# Patient Record
Sex: Male | Born: 1985 | Race: Black or African American | Hispanic: No | Marital: Single | Smoking: Never smoker
Health system: Southern US, Community
[De-identification: ages and names within clinical notes are randomized; demographics above are authoritative.]

## PROBLEM LIST (undated history)

## (undated) DIAGNOSIS — J45909 Unspecified asthma, uncomplicated: Secondary | ICD-10-CM

---

## 2017-07-12 ENCOUNTER — Emergency Department (HOSPITAL_COMMUNITY)
Admission: EM | Admit: 2017-07-12 | Discharge: 2017-07-13 | Disposition: A | Discharge status: Home / Self Care | Payer: Self-pay | Attending: Emergency Medicine | Admitting: Emergency Medicine

## 2017-07-12 DIAGNOSIS — F17200 Nicotine dependence, unspecified, uncomplicated: Secondary | ICD-10-CM | POA: Insufficient documentation

## 2017-07-12 DIAGNOSIS — J45909 Unspecified asthma, uncomplicated: Secondary | ICD-10-CM | POA: Insufficient documentation

## 2017-07-12 DIAGNOSIS — F1010 Alcohol abuse, uncomplicated: Secondary | ICD-10-CM | POA: Insufficient documentation

## 2017-07-12 HISTORY — DX: Unspecified asthma, uncomplicated: J45.909

## 2017-07-13 ENCOUNTER — Encounter (HOSPITAL_COMMUNITY): Payer: Self-pay | Admitting: *Deleted

## 2017-07-13 ENCOUNTER — Other Ambulatory Visit: Payer: Self-pay

## 2017-07-13 NOTE — ED Notes (Signed)
Pt wanded in triage, Pt was asked to remove his belongings. Pt had a pack of cigarettes, lighter, tissue and a loose cigarette. Verified by security that wanded pt.

## 2017-07-13 NOTE — ED Triage Notes (Addendum)
Pt stated "my mother brought me here.  She wanted me to come here.  I don't drink on the week days.  I ain't going to lie to you, I do drink and smoke marijuana.  I smoke marijuana every day.  I drink maybe 5 beers out of a 12 pack on the weekends."

## 2017-07-13 NOTE — Discharge Instructions (Signed)
To find a primary care or specialty doctor please call [phone] or [phone] to access "[facility] [facility] Find a Doctor Service." ° °You may also go on the [facility] website at [email or web address] ° °There are also multiple [facility], [facility] practices throughout the [place] that are frequently accepting new patients. You may find a clinic that is close to your home and contact them. ° °[facility] -  °[address] °[place] [address] °[phone] ° ° °[facility] -  °[address] °[place] [address] [phone] ° ° °[facility] - °[address]  °[place] [address] °[phone] ° ° °

## 2017-07-13 NOTE — ED Provider Notes (Signed)
TIME SEEN: 1:07 AM  CHIEF COMPLAINT: "My family wanted me to come here for alcohol detox"  HPI: Patient is a 31 year old male with history of alcohol abuse who presents to the emergency department stating that his family wanted him to come for evaluation for alcohol detox.  He states that he drinks several beers and liquor daily.  States he is able to go several days without drinking and never has hallucinations, tremors or seizures.  He smokes marijuana occasionally.  He does report drinking tonight.  Denies SI, HI or hallucinations.  He denies any medical complaints including pain, fevers, cough, shortness of breath, vomiting, diarrhea.  He states he is going to rehab once before.  ROS: See HPI Constitutional: no fever  Eyes: no drainage  ENT: no runny nose   Cardiovascular:  no chest pain  Resp: no SOB  GI: no vomiting GU: no dysuria Integumentary: no rash  Allergy: no hives  Musculoskeletal: no leg swelling  Neurological: no slurred speech ROS otherwise negative  PAST MEDICAL HISTORY/PAST SURGICAL HISTORY:  Past Medical History:  Diagnosis Date  . Asthma    Pt stated "as a child"    MEDICATIONS:  Prior to Admission medications   Not on File    ALLERGIES:  Allergies not on file  SOCIAL HISTORY:  Social History   Tobacco Use  . Smoking status: Current Every Day Smoker  Substance Use Topics  . Alcohol use: Yes    FAMILY HISTORY: No family history on file.  EXAM: BP 139/86 (BP Location: Right Arm)   Pulse 89   Temp 97.9 F (36.6 C) (Oral)   Resp 20   Ht 6\' 4"  (1.93 m)   Wt 87.1 kg (192 lb)   SpO2 100%   BMI 23.37 kg/m  CONSTITUTIONAL: Alert and oriented and responds appropriately to questions. Well-appearing; well-nourished, smiling, pleasant, no complaints, well-hydrated, afebrile HEAD: Normocephalic EYES: Conjunctivae clear, pupils appear equal, EOMI ENT: normal nose; moist mucous membranes NECK: Supple, no meningismus, no nuchal rigidity, no LAD   CARD: RRR; S1 and S2 appreciated; no murmurs, no clicks, no rubs, no gallops RESP: Normal chest excursion without splinting or tachypnea; breath sounds clear and equal bilaterally; no wheezes, no rhonchi, no rales, no hypoxia or respiratory distress, speaking full sentences ABD/GI: Normal bowel sounds; non-distended; soft, non-tender, no rebound, no guarding, no peritoneal signs, no hepatosplenomegaly BACK:  The back appears normal and is non-tender to palpation, there is no CVA tenderness EXT: Normal ROM in all joints; non-tender to palpation; no edema; normal capillary refill; no cyanosis, no calf tenderness or swelling    SKIN: Normal color for age and race; warm; no rash NEURO: Moves all extremities equally PSYCH: The patient's mood and manner are appropriate. Grooming and personal hygiene are appropriate.  No SI, HI or hallucinations.  MEDICAL DECISION MAKING: Patient here with request for alcohol detox.  He has never had life-threatening withdrawal before.  No history of DTs, seizures or even tremors.  He is able to go several days without drinking without any issue.  I do not feel he needs admission to the hospital for detox.  He looks very comfortable currently and not significantly intoxicated.  He has no psychiatric safety concern.  No medical complaints.  I feel he can follow-up with a rehab facility as an outpatient.  I feel he needs to set this up on his own when he is ready to quit drinking.   At this time, I do not feel there is any  life-threatening condition present. I have reviewed and discussed all results (EKG, imaging, lab, urine as appropriate) and exam findings with patient/family. I have reviewed nursing notes and appropriate previous records.  I feel the patient is safe to be discharged home without further emergent workup and can continue workup as an outpatient as needed. Discussed usual and customary return precautions. Patient/family verbalize understanding and are  comfortable with this plan.  Outpatient follow-up has been provided if needed. All questions have been answered.      Champion Corales, Layla MawKristen N, DO 07/13/17 0111

## 2017-07-13 NOTE — ED Notes (Signed)
Pt now stating "I talked to my mother and she has my wallet."  Security @ BS when pt stated this.

## 2017-07-13 NOTE — ED Notes (Signed)
Pt now stating "I had a pack of cigarettes and my wallet in the bag."  Informed pt he had bagged his own possessions and bag was placed in locker.  Pt stated "well, don't worry about it."

## 2017-10-20 ENCOUNTER — Encounter (HOSPITAL_COMMUNITY): Payer: Self-pay | Admitting: Emergency Medicine

## 2017-10-20 ENCOUNTER — Emergency Department (HOSPITAL_COMMUNITY)
Admission: EM | Admit: 2017-10-20 | Discharge: 2017-10-20 | Disposition: A | Discharge status: Home / Self Care | Payer: Self-pay | Attending: Emergency Medicine | Admitting: Emergency Medicine

## 2017-10-20 ENCOUNTER — Other Ambulatory Visit: Payer: Self-pay

## 2017-10-20 DIAGNOSIS — R21 Rash and other nonspecific skin eruption: Secondary | ICD-10-CM | POA: Insufficient documentation

## 2017-10-20 DIAGNOSIS — F17210 Nicotine dependence, cigarettes, uncomplicated: Secondary | ICD-10-CM | POA: Insufficient documentation

## 2017-10-20 DIAGNOSIS — L298 Other pruritus: Secondary | ICD-10-CM | POA: Insufficient documentation

## 2017-10-20 LAB — URINALYSIS, ROUTINE W REFLEX MICROSCOPIC
Bilirubin Urine: NEGATIVE
Glucose, UA: NEGATIVE mg/dL
Hgb urine dipstick: NEGATIVE
Ketones, ur: NEGATIVE mg/dL
LEUKOCYTES UA: NEGATIVE
NITRITE: NEGATIVE
PH: 6 (ref 5.0–8.0)
Protein, ur: NEGATIVE mg/dL
SPECIFIC GRAVITY, URINE: 1.014 (ref 1.005–1.030)

## 2017-10-20 MED ORDER — PERMETHRIN 5 % EX CREA: TOPICAL_CREAM | CUTANEOUS | 0 refills | Status: DC

## 2017-10-20 MED ORDER — HYDROXYZINE HCL 25 MG PO TABS: 25.0000 mg | ORAL_TABLET | Freq: Four times a day (QID) | ORAL | 0 refills | Status: DC

## 2017-10-20 NOTE — ED Notes (Signed)
Pt noticed a pruritic rash that started on his genitalia and spread to the rest of his body onset two weeks ago.

## 2017-10-20 NOTE — ED Provider Notes (Signed)
[facility]-EMERGENCY DEPT Provider Note   CSN: [id number] Arrival date & time: 10/20/17  0507    History   Chief Complaint Chief Complaint  Patient presents with  . SEXUALLY TRANSMITTED DISEASE    HPI Jeremy Cline is a 32 y.o. male who presents with a rash and concern for STD. He states that he's had a rash on his penis for the past 2 weeks. It is very itchy. He called the person he had sexual contact with and they denied they had any STD symptoms. He also reports a rash on his entire body. He has never had an STD or symptoms like this before. He denies fever, chills, abdominal pain, N/V, penis pain, testicular pain, genital sores, penile discharge or dysuria.  HPI  Past Medical History:  Diagnosis Date  . Asthma    Pt stated "as a child"    There are no active problems to display for this patient.   History reviewed. No pertinent surgical history.     Home Medications    Prior to Admission medications   Not on File    Family History History reviewed. No pertinent family history.  Social History Social History   Tobacco Use  . Smoking status: Current Every Day Smoker  . Smokeless tobacco: Never Used  Substance Use Topics  . Alcohol use: Yes  . Drug use: Yes    Types: Marijuana     Allergies   Patient has no known allergies.   Review of Systems Review of Systems  Constitutional: Negative for fever.  Genitourinary: Negative for dysuria and genital sores.  Skin: Positive for rash.     Physical Exam Updated Vital Signs BP (!) 145/73 (BP Location: Right Arm)   Pulse (!) 105   Temp (!) 97.4 F (36.3 C) (Oral)   Resp 16   Ht 6\' 4"  (1.93 m)   Wt 88.5 kg (195 lb 3.2 oz)   SpO2 100%   BMI 23.76 kg/m   Physical Exam  Constitutional: He is oriented to person, place, and time. He appears well-developed and well-nourished. No distress.  HENT:  Head: Normocephalic and atraumatic.  Eyes: Conjunctivae are normal. Pupils are  equal, round, and reactive to light. Right eye exhibits no discharge. Left eye exhibits no discharge. No scleral icterus.  Neck: Normal range of motion.  Cardiovascular: Normal rate.  Pulmonary/Chest: Effort normal. No respiratory distress.  Abdominal: He exhibits no distension.  Genitourinary:  Genitourinary Comments: No inguinal lymphadenopathy or inguinal hernia noted. Circumcised penis which has a papular rash consistent with rash on the rest of the body. No ulcers. Testicles are nontender with normal lie. Normal scrotal appearance. No obvious discharge noted. Chaperone Olena Leatherwood(Janeen, RN) present during exam.    Neurological: He is alert and oriented to person, place, and time.  Skin: Skin is warm and dry. Rash (generalized, erythematous, papular rash with evidence of burrowing in between web spaces) noted.  Psychiatric: He has a normal mood and affect. His behavior is normal.  Nursing note and vitals reviewed.    ED Treatments / Results  Labs (all labs ordered are listed, but only abnormal results are displayed) Labs Reviewed  URINALYSIS, ROUTINE W REFLEX MICROSCOPIC  RPR  HIV ANTIBODY (ROUTINE TESTING)  GC/CHLAMYDIA PROBE AMP ([place]) NOT AT [facility]    EKG  EKG Interpretation None       Radiology No results found.  Procedures Procedures (including critical care time)  Medications Ordered in ED Medications - No data to display  Initial Impression / Assessment and Plan / ED Course  I have reviewed the triage vital signs and the nursing notes.  Pertinent labs & imaging results that were available during my care of the patient were reviewed by me and considered in my medical decision making (see chart for details).  32 year old male presents with generalized rash. He is concerned he has an STD however exam is not consistent with this. Exam is most consistent with scabies. STD screening tests were sent.  UA is negative. He will be treated with Permethrin and given rx for  Atarax. He was advised to wash clothes and bedding. He was given return precautions.  Final Clinical Impressions(s) / ED Diagnoses   Final diagnoses:  Rash and nonspecific skin eruption    ED Discharge Orders    None       Bethel Born, PA-C 10/20/17 0756    Phillis Haggis, MD 10/20/17 0800

## 2017-10-20 NOTE — ED Triage Notes (Signed)
Patient is complaining of a STD. Patient has a rash on his genitalia area. Patient state that is reddened. Patient states that he is itching. No discharge from penis. Patient is not having painful urination.

## 2017-10-20 NOTE — Discharge Instructions (Signed)
Apply permethrin cream from the neck down. Keep on for 8 hours and wash off. You can repeat in one week if your rash is not improving You STD screen was sent off today. You will be notified if anything is abnormal. Take Hydroxyzine for itching as needed. This medicine can make you sleepy. Wash all clothes and bedding in hot water Return if worsening

## 2017-10-21 LAB — RPR: RPR Ser Ql: NONREACTIVE

## 2017-10-21 LAB — HIV ANTIBODY (ROUTINE TESTING W REFLEX): HIV Screen 4th Generation wRfx: NONREACTIVE

## 2017-10-22 LAB — GC/CHLAMYDIA PROBE AMP (~~LOC~~) NOT AT ARMC
CHLAMYDIA, DNA PROBE: NEGATIVE
Neisseria Gonorrhea: NEGATIVE

## 2018-10-29 ENCOUNTER — Encounter (HOSPITAL_COMMUNITY): Payer: Self-pay

## 2018-10-29 ENCOUNTER — Ambulatory Visit (HOSPITAL_COMMUNITY)
Admission: EM | Admit: 2018-10-29 | Discharge: 2018-10-29 | Disposition: A | Discharge status: Home / Self Care | Payer: Self-pay | Attending: Family Medicine | Admitting: Family Medicine

## 2018-10-29 ENCOUNTER — Other Ambulatory Visit: Payer: Self-pay

## 2018-10-29 DIAGNOSIS — J111 Influenza due to unidentified influenza virus with other respiratory manifestations: Secondary | ICD-10-CM

## 2018-10-29 DIAGNOSIS — R69 Illness, unspecified: Secondary | ICD-10-CM

## 2018-10-29 NOTE — Discharge Instructions (Addendum)

## 2018-10-29 NOTE — ED Provider Notes (Signed)
[facility]   [id number] 10/29/18 Arrival Time: 3785  ASSESSMENT & PLAN:  1. Influenza-like illness    See AVS for discharge instructions.  Discussed typical duration of symptoms. OTC symptom care as needed. Ensure adequate fluid intake and rest. May f/u with PCP or here as needed.  Reviewed expectations re: course of current medical issues. Questions answered. Outlined signs and symptoms indicating need for more acute intervention. Patient verbalized understanding. After Visit Summary given.   SUBJECTIVE: History from: patient.  Jeremy Cline is a 33 y.o. male who presents with complaint of nasal congestion, post-nasal drainage, and a persistent dry cough; without sore throat. Onset abrupt, 2 d ago; with fatigue and with body aches. SOB: none. Wheezing: none. Occasional loose stool without frank diarrhea. Fever: questions subjective with chills. Overall normal PO intake without n/v. Known sick contacts: none. No specific or significant aggravating or alleviating factors reported. OTC treatment: TheraFlu without much relief.  Social History   Tobacco Use  Smoking Status Current Every Day Smoker  Smokeless Tobacco Never Used    ROS: As per HPI.   OBJECTIVE:  Vitals:   10/29/18 0955 10/29/18 0956  BP: (!) 149/89   Pulse: 82   Resp: 18   Temp: 98.2 F (36.8 C)   TempSrc: Oral   SpO2: 100%   Weight:  83.9 kg    General appearance: alert; appears fatigued HEENT: nasal congestion; clear runny nose; throat irritation secondary to post-nasal drainage Neck: supple without LAD CV: RRR Lungs: unlabored respirations, symmetrical air entry without wheezing; cough: mild Abd: soft Ext: no LE edema Skin: warm and dry Psychological: alert and cooperative; normal mood and affect   No Known Allergies  Past Medical History:  Diagnosis Date  . Asthma    Pt stated "as a child"   Family History  Problem Relation Age of Onset  . Diabetes Mother    Social  History   Socioeconomic History  . Marital status: Single    Spouse name: Not on file  . Number of children: Not on file  . Years of education: Not on file  . Highest education level: Not on file  Occupational History  . Not on file  Social Needs  . Financial resource strain: Not on file  . Food insecurity:    Worry: Not on file    Inability: Not on file  . Transportation needs:    Medical: Not on file    Non-medical: Not on file  Tobacco Use  . Smoking status: Current Every Day Smoker  . Smokeless tobacco: Never Used  Substance and Sexual Activity  . Alcohol use: Yes  . Drug use: Yes    Types: Marijuana  . Sexual activity: Not on file  Lifestyle  . Physical activity:    Days per week: Not on file    Minutes per session: Not on file  . Stress: Not on file  Relationships  . Social connections:    Talks on phone: Not on file    Gets together: Not on file    Attends religious service: Not on file    Active member of club or organization: Not on file    Attends meetings of clubs or organizations: Not on file    Relationship status: Not on file  . Intimate partner violence:    Fear of current or ex partner: Not on file    Emotionally abused: Not on file    Physically abused: Not on file    Forced sexual  activity: Not on file  Other Topics Concern  . Not on file  Social History Narrative  . Not on file           Mardella Layman, MD 10/29/18 1501

## 2018-10-29 NOTE — ED Triage Notes (Signed)
Travel none and Fever none. Pt cc cough , diarrhea, body aches and fatigued x 2 days. Pt has tried Thera flu Sunday night. Pt needs a work note.

## 2018-11-04 ENCOUNTER — Ambulatory Visit (HOSPITAL_COMMUNITY)
Admission: EM | Admit: 2018-11-04 | Discharge: 2018-11-04 | Disposition: A | Discharge status: Home / Self Care | Payer: Self-pay

## 2018-11-04 NOTE — ED Triage Notes (Signed)
Pt made to come here by employer due to needing note saying pt may return to work today and was SEEN today. Per dr. Leonides Grills, okay to check temp and given note and do nurse visit.

## 2019-12-28 ENCOUNTER — Emergency Department (HOSPITAL_COMMUNITY)
Admission: EM | Admit: 2019-12-28 | Discharge: 2019-12-28 | Disposition: A | Discharge status: Home / Self Care | Payer: Commercial Managed Care - PPO | Attending: Emergency Medicine | Admitting: Emergency Medicine

## 2019-12-28 ENCOUNTER — Encounter (HOSPITAL_COMMUNITY): Payer: Self-pay

## 2019-12-28 ENCOUNTER — Emergency Department (HOSPITAL_COMMUNITY): Payer: Commercial Managed Care - PPO

## 2019-12-28 ENCOUNTER — Other Ambulatory Visit: Payer: Self-pay

## 2019-12-28 DIAGNOSIS — F17200 Nicotine dependence, unspecified, uncomplicated: Secondary | ICD-10-CM | POA: Diagnosis not present

## 2019-12-28 DIAGNOSIS — F1210 Cannabis abuse, uncomplicated: Secondary | ICD-10-CM | POA: Insufficient documentation

## 2019-12-28 DIAGNOSIS — Y9367 Activity, basketball: Secondary | ICD-10-CM | POA: Insufficient documentation

## 2019-12-28 DIAGNOSIS — Y92310 Basketball court as the place of occurrence of the external cause: Secondary | ICD-10-CM | POA: Insufficient documentation

## 2019-12-28 DIAGNOSIS — J45909 Unspecified asthma, uncomplicated: Secondary | ICD-10-CM | POA: Insufficient documentation

## 2019-12-28 DIAGNOSIS — S93401A Sprain of unspecified ligament of right ankle, initial encounter: Secondary | ICD-10-CM | POA: Insufficient documentation

## 2019-12-28 DIAGNOSIS — X509XXA Other and unspecified overexertion or strenuous movements or postures, initial encounter: Secondary | ICD-10-CM | POA: Insufficient documentation

## 2019-12-28 DIAGNOSIS — S99911A Unspecified injury of right ankle, initial encounter: Secondary | ICD-10-CM | POA: Diagnosis present

## 2019-12-28 DIAGNOSIS — Y998 Other external cause status: Secondary | ICD-10-CM | POA: Diagnosis not present

## 2019-12-28 MED ORDER — HYDROCODONE-ACETAMINOPHEN 5-325 MG PO TABS
1.0000 | ORAL_TABLET | Freq: Once | ORAL | Status: AC
  Administered 2019-12-28: 1 via ORAL
  Filled 2019-12-28: qty 1

## 2019-12-28 MED ORDER — NAPROXEN 500 MG PO TABS: 500.0000 mg | ORAL_TABLET | Freq: Two times a day (BID) | ORAL | 0 refills | Status: DC

## 2019-12-28 NOTE — Discharge Instructions (Addendum)
You were evaluated in the Emergency Department and after careful evaluation, we did not find any emergent condition requiring admission or further testing in the hospital.  Your exam/testing today is overall reassuring.  X-rays did not show any broken bones.  Symptoms consistent with a sprain of the ankle.  Try to keep weight off of the ankle until it is less painful to do so.  Use the brace provided for support, use crutches as needed, use the Naprosyn anti-inflammatory for pain.  We also recommend ice or cold compresses for the next 2 days to help with swelling.  Please return to the Emergency Department if you experience any worsening of your condition.  We encourage you to follow up with a primary care provider.  Thank you for allowing [place] to be a part of your care.

## 2019-12-28 NOTE — ED Provider Notes (Signed)
[facility]-EMERGENCY DEPT [facility] Emergency Department Provider Note MRN:  [id number]  Arrival date & time: 12/28/19     Chief Complaint   Foot Pain   History of Present Illness   Jeremy Cline is a 34 y.o. year-old male with no pertinent past medical history presenting to the ED with chief complaint of foot pain.  Patient jumped up for the rebound while playing basketball, came down awkwardly on right foot on top of another player's foot.  Forced inversion.  No other injuries, did not fall, no head trauma.  Try to continue playing but the foot and ankle became more and more painful.  Currently moderate in severity, trouble ambulating due to the pain.  Review of Systems  A problem-focused ROS was performed. Positive for ankle pain.  Patient denies head trauma.  Patient's Health History    Past Medical History:  Diagnosis Date  . Asthma    Pt stated "as a child"    History reviewed. No pertinent surgical history.  Family History  Problem Relation Age of Onset  . Diabetes Mother     Social History   Socioeconomic History  . Marital status: Single    Spouse name: Not on file  . Number of children: Not on file  . Years of education: Not on file  . Highest education level: Not on file  Occupational History  . Not on file  Tobacco Use  . Smoking status: Current Every Day Smoker  . Smokeless tobacco: Never Used  Substance and Sexual Activity  . Alcohol use: Yes  . Drug use: Yes    Types: Marijuana  . Sexual activity: Not on file  Other Topics Concern  . Not on file  Social History Narrative  . Not on file   Social Determinants of Health   Financial Resource Strain:   . Difficulty of Paying Living Expenses:   Food Insecurity:   . Worried About Programme researcher, broadcasting/film/video in the Last Year:   . Barista in the Last Year:   Transportation Needs:   . Freight forwarder (Medical):   Marland Kitchen Lack of Transportation (Non-Medical):   Physical Activity:   . Days of  Exercise per Week:   . Minutes of Exercise per Session:   Stress:   . Feeling of Stress :   Social Connections:   . Frequency of Communication with Friends and Family:   . Frequency of Social Gatherings with Friends and Family:   . Attends Religious Services:   . Active Member of Clubs or Organizations:   . Attends Banker Meetings:   Marland Kitchen Marital Status:   Intimate Partner Violence:   . Fear of Current or Ex-Partner:   . Emotionally Abused:   Marland Kitchen Physically Abused:   . Sexually Abused:      Physical Exam   Vitals:   12/28/19 2037  BP: 131/72  Pulse: (!) 103  Resp: 19  Temp: 98.2 F (36.8 C)  SpO2: 100%    CONSTITUTIONAL: Well-appearing, NAD NEURO:  Alert and oriented x 3, no focal deficits EYES:  eyes equal and reactive ENT/NECK:  no LAD, no JVD CARDIO: Regular rate, well-perfused, normal S1 and S2 PULM:  CTAB no wheezing or rhonchi GI/GU:  normal bowel sounds, non-distended, non-tender MSK/SPINE:  No gross deformities, no edema, tenderness to palpation to the right ankle, no significant tenderness to the midfoot, neurovascular intact distally, no tenderness to palpation to the shin or knee SKIN:  no rash, atraumatic PSYCH:  Appropriate speech and behavior  *Additional and/or pertinent findings included in MDM below  Diagnostic and Interventional Summary    EKG Interpretation  Date/Time:    Ventricular Rate:    PR Interval:    QRS Duration:   QT Interval:    QTC Calculation:   R Axis:     Text Interpretation:        Labs Reviewed - No data to display  DG Foot Complete Right  Final Result    DG Ankle Complete Right  Final Result      Medications  HYDROcodone-acetaminophen (NORCO/VICODIN) 5-325 MG per tablet 1 tablet (1 tablet Oral Given 12/28/19 2111)     Procedures  /  Critical Care Procedures  ED Course and Medical Decision Making  I have reviewed the triage vital signs, the nursing notes, and pertinent available records from the  EMR.  Listed above are laboratory and imaging tests that I personally ordered, reviewed, and interpreted and then considered in my medical decision making (see below for details).      X-rays are without signs of fracture, question lucency and artifact to the navicular, there is no associated tenderness on exam and so I do favor this to be artifact.  Appropriate for discharge with RICE for sprain.    Barth Kirks. Sedonia Small, [place] [email or web address]  Final Clinical Impressions(s) / ED Diagnoses     ICD-10-CM   1. Sprain of right ankle, unspecified ligament, initial encounter  S93.401A     ED Discharge Orders         Ordered    naproxen (NAPROSYN) 500 MG tablet  2 times daily     12/28/19 2201           Discharge Instructions Discussed with and Provided to Patient:     Discharge Instructions     You were evaluated in the Emergency Department and after careful evaluation, we did not find any emergent condition requiring admission or further testing in the hospital.  Your exam/testing today is overall reassuring.  X-rays did not show any broken bones.  Symptoms consistent with a sprain of the ankle.  Try to keep weight off of the ankle until it is less painful to do so.  Use the brace provided for support, use crutches as needed, use the Naprosyn anti-inflammatory for pain.  We also recommend ice or cold compresses for the next 2 days to help with swelling.  Please return to the Emergency Department if you experience any worsening of your condition.  We encourage you to follow up with a primary care provider.  Thank you for allowing [place] to be a part of your care.      Maudie Flakes, MD 12/28/19 2205

## 2019-12-28 NOTE — ED Triage Notes (Signed)
Arrived POV from home. Patient reports he was playing basketball earlier today, when he came down ackwardly on his right foot. Patient reports pain 10/10; able to bear some weight on right foot, but it is very painful.

## 2020-04-07 ENCOUNTER — Inpatient Hospital Stay (HOSPITAL_COMMUNITY)
Admission: EM | Admit: 2020-04-07 | Discharge: 2020-04-11 | DRG: 958 | Disposition: A | Discharge status: Home / Self Care | Payer: Self-pay | Attending: Surgery | Admitting: Surgery

## 2020-04-07 ENCOUNTER — Emergency Department (HOSPITAL_COMMUNITY): Payer: Self-pay

## 2020-04-07 DIAGNOSIS — Z23 Encounter for immunization: Secondary | ICD-10-CM

## 2020-04-07 DIAGNOSIS — S270XXA Traumatic pneumothorax, initial encounter: Secondary | ICD-10-CM | POA: Diagnosis present

## 2020-04-07 DIAGNOSIS — T148XXA Other injury of unspecified body region, initial encounter: Secondary | ICD-10-CM

## 2020-04-07 DIAGNOSIS — S92333A Displaced fracture of third metatarsal bone, unspecified foot, initial encounter for closed fracture: Secondary | ICD-10-CM | POA: Diagnosis present

## 2020-04-07 DIAGNOSIS — S92041A Displaced other fracture of tuberosity of right calcaneus, initial encounter for closed fracture: Secondary | ICD-10-CM | POA: Diagnosis present

## 2020-04-07 DIAGNOSIS — S8262XB Displaced fracture of lateral malleolus of left fibula, initial encounter for open fracture type I or II: Principal | ICD-10-CM | POA: Diagnosis present

## 2020-04-07 DIAGNOSIS — H9192 Unspecified hearing loss, left ear: Secondary | ICD-10-CM | POA: Diagnosis present

## 2020-04-07 DIAGNOSIS — I959 Hypotension, unspecified: Secondary | ICD-10-CM | POA: Diagnosis present

## 2020-04-07 DIAGNOSIS — R000 Tachycardia, unspecified: Secondary | ICD-10-CM | POA: Diagnosis present

## 2020-04-07 DIAGNOSIS — S065X9A Traumatic subdural hemorrhage with loss of consciousness of unspecified duration, initial encounter: Secondary | ICD-10-CM | POA: Diagnosis present

## 2020-04-07 DIAGNOSIS — S92322A Displaced fracture of second metatarsal bone, left foot, initial encounter for closed fracture: Secondary | ICD-10-CM | POA: Diagnosis present

## 2020-04-07 DIAGNOSIS — S0219XA Other fracture of base of skull, initial encounter for closed fracture: Secondary | ICD-10-CM | POA: Diagnosis present

## 2020-04-07 DIAGNOSIS — J939 Pneumothorax, unspecified: Secondary | ICD-10-CM

## 2020-04-07 DIAGNOSIS — S92332A Displaced fracture of third metatarsal bone, left foot, initial encounter for closed fracture: Secondary | ICD-10-CM | POA: Diagnosis present

## 2020-04-07 DIAGNOSIS — Z20822 Contact with and (suspected) exposure to covid-19: Secondary | ICD-10-CM | POA: Diagnosis present

## 2020-04-07 DIAGNOSIS — S92309A Fracture of unspecified metatarsal bone(s), unspecified foot, initial encounter for closed fracture: Secondary | ICD-10-CM | POA: Diagnosis present

## 2020-04-07 DIAGNOSIS — D62 Acute posthemorrhagic anemia: Secondary | ICD-10-CM | POA: Diagnosis not present

## 2020-04-07 DIAGNOSIS — X58XXXA Exposure to other specified factors, initial encounter: Secondary | ICD-10-CM | POA: Diagnosis present

## 2020-04-07 DIAGNOSIS — S066X9A Traumatic subarachnoid hemorrhage with loss of consciousness of unspecified duration, initial encounter: Secondary | ICD-10-CM | POA: Diagnosis present

## 2020-04-07 DIAGNOSIS — S30810A Abrasion of lower back and pelvis, initial encounter: Secondary | ICD-10-CM | POA: Diagnosis present

## 2020-04-07 DIAGNOSIS — S92323A Displaced fracture of second metatarsal bone, unspecified foot, initial encounter for closed fracture: Secondary | ICD-10-CM | POA: Diagnosis present

## 2020-04-07 DIAGNOSIS — S0101XA Laceration without foreign body of scalp, initial encounter: Secondary | ICD-10-CM | POA: Diagnosis present

## 2020-04-07 DIAGNOSIS — S0291XA Unspecified fracture of skull, initial encounter for closed fracture: Secondary | ICD-10-CM | POA: Diagnosis present

## 2020-04-07 DIAGNOSIS — S93122A Dislocation of metatarsophalangeal joint of left great toe, initial encounter: Secondary | ICD-10-CM | POA: Diagnosis present

## 2020-04-07 DIAGNOSIS — M549 Dorsalgia, unspecified: Secondary | ICD-10-CM

## 2020-04-07 DIAGNOSIS — S02119A Unspecified fracture of occiput, initial encounter for closed fracture: Secondary | ICD-10-CM | POA: Diagnosis present

## 2020-04-07 DIAGNOSIS — R40241 Glasgow coma scale score 13-15, unspecified time: Secondary | ICD-10-CM | POA: Diagnosis present

## 2020-04-07 DIAGNOSIS — Y92411 Interstate highway as the place of occurrence of the external cause: Secondary | ICD-10-CM

## 2020-04-07 DIAGNOSIS — T1490XA Injury, unspecified, initial encounter: Secondary | ICD-10-CM

## 2020-04-07 LAB — CBC
HCT: 41.5 % (ref 39.0–52.0)
Hemoglobin: 13.6 g/dL (ref 13.0–17.0)
MCH: 31.9 pg (ref 26.0–34.0)
MCHC: 32.8 g/dL (ref 30.0–36.0)
MCV: 97.4 fL (ref 80.0–100.0)
Platelets: 212 10*3/uL (ref 150–400)
RBC: 4.26 MIL/uL (ref 4.22–5.81)
RDW: 14.4 % (ref 11.5–15.5)
WBC: 9.9 10*3/uL (ref 4.0–10.5)
nRBC: 0 % (ref 0.0–0.2)

## 2020-04-07 LAB — ETHANOL: Alcohol, Ethyl (B): 154 mg/dL — ABNORMAL HIGH (ref <10)

## 2020-04-07 LAB — I-STAT CHEM 8, ED
BUN: 10 mg/dL (ref 6–20)
Calcium, Ion: 1.08 mmol/L — ABNORMAL LOW (ref 1.15–1.40)
Chloride: 101 mmol/L (ref 98–111)
Creatinine, Ser: 1.4 mg/dL — ABNORMAL HIGH (ref 0.61–1.24)
Glucose, Bld: 113 mg/dL — ABNORMAL HIGH (ref 70–99)
HCT: 44 % (ref 39.0–52.0)
Hemoglobin: 15 g/dL (ref 13.0–17.0)
Potassium: 3.5 mmol/L (ref 3.5–5.1)
Sodium: 138 mmol/L (ref 135–145)
TCO2: 23 mmol/L (ref 22–32)

## 2020-04-07 LAB — PROTIME-INR
INR: 1 (ref 0.8–1.2)
Prothrombin Time: 12.9 seconds (ref 11.4–15.2)

## 2020-04-07 MED ORDER — LACTATED RINGERS IV BOLUS
1000.0000 mL | Freq: Once | INTRAVENOUS | Status: AC
  Administered 2020-04-07: 1000 mL via INTRAVENOUS

## 2020-04-07 MED ORDER — TETANUS-DIPHTH-ACELL PERTUSSIS 5-2.5-18.5 LF-MCG/0.5 IM SUSP
0.5000 mL | Freq: Once | INTRAMUSCULAR | Status: AC
  Administered 2020-04-07: 0.5 mL via INTRAMUSCULAR
  Filled 2020-04-07: qty 0.5

## 2020-04-07 MED ORDER — CEFAZOLIN SODIUM-DEXTROSE 2-4 GM/100ML-% IV SOLN
2.0000 g | Freq: Once | INTRAVENOUS | Status: AC
  Administered 2020-04-07: 2 g via INTRAVENOUS
  Filled 2020-04-07: qty 100

## 2020-04-07 MED ORDER — SODIUM CHLORIDE 0.9% IV SOLUTION: Freq: Once | INTRAVENOUS | Status: DC

## 2020-04-07 MED ORDER — IOHEXOL 300 MG/ML  SOLN
100.0000 mL | Freq: Once | INTRAMUSCULAR | Status: AC | PRN
  Administered 2020-04-08: 100 mL via INTRAVENOUS
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
## 2020-04-07 NOTE — ED Notes (Signed)
Taken to CT.

## 2020-04-07 NOTE — ED Notes (Signed)
Gave bedside report to [facility]
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
## 2020-04-07 NOTE — Progress Notes (Signed)
Orthopedic Tech Progress Note Patient Details:  Gregorey Nabor 1986-05-27 [id number] Level 2 Trauma  Patient ID: Howell Pringle, male   DOB: 1986-07-16, 34 y.o.   MRN: [id number]   Smitty Pluck 04/07/2020, 11:56 PM

## 2020-04-07 NOTE — ED Notes (Signed)
X-ray at bedside
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
## 2020-04-08 ENCOUNTER — Inpatient Hospital Stay (HOSPITAL_COMMUNITY): Payer: Self-pay | Admitting: Certified Registered Nurse Anesthetist

## 2020-04-08 ENCOUNTER — Encounter (HOSPITAL_COMMUNITY): Payer: Self-pay | Admitting: Emergency Medicine

## 2020-04-08 ENCOUNTER — Emergency Department (HOSPITAL_COMMUNITY): Payer: Self-pay

## 2020-04-08 ENCOUNTER — Encounter (HOSPITAL_COMMUNITY): Admission: EM | Disposition: A | Discharge status: Home / Self Care | Payer: Self-pay | Source: Home / Self Care

## 2020-04-08 ENCOUNTER — Inpatient Hospital Stay (HOSPITAL_COMMUNITY): Payer: Self-pay

## 2020-04-08 DIAGNOSIS — S0291XA Unspecified fracture of skull, initial encounter for closed fracture: Secondary | ICD-10-CM | POA: Diagnosis present

## 2020-04-08 HISTORY — PX: APPLICATION OF WOUND VAC: SHX5189

## 2020-04-08 HISTORY — PX: CLOSED REDUCTION METATARSAL: SHX5774

## 2020-04-08 HISTORY — PX: SCALP LACERATION REPAIR: SHX6089

## 2020-04-08 HISTORY — PX: I & D EXTREMITY: SHX5045

## 2020-04-08 LAB — CBC
HCT: 40 % (ref 39.0–52.0)
Hemoglobin: 13.6 g/dL (ref 13.0–17.0)
MCH: 32.4 pg (ref 26.0–34.0)
MCHC: 34 g/dL (ref 30.0–36.0)
MCV: 95.2 fL (ref 80.0–100.0)
Platelets: 226 10*3/uL (ref 150–400)
RBC: 4.2 MIL/uL — ABNORMAL LOW (ref 4.22–5.81)
RDW: 14.1 % (ref 11.5–15.5)
WBC: 15.6 10*3/uL — ABNORMAL HIGH (ref 4.0–10.5)
nRBC: 0 % (ref 0.0–0.2)

## 2020-04-08 LAB — BASIC METABOLIC PANEL
Anion gap: 11 (ref 5–15)
BUN: 6 mg/dL (ref 6–20)
CO2: 22 mmol/L (ref 22–32)
Calcium: 9.1 mg/dL (ref 8.9–10.3)
Chloride: 104 mmol/L (ref 98–111)
Creatinine, Ser: 0.92 mg/dL (ref 0.61–1.24)
GFR calc Af Amer: >60 mL/min (ref >60)
GFR calc non Af Amer: >60 mL/min (ref >60)
Glucose, Bld: 117 mg/dL — ABNORMAL HIGH (ref 70–99)
Potassium: 3.6 mmol/L (ref 3.5–5.1)
Sodium: 137 mmol/L (ref 135–145)

## 2020-04-08 LAB — TYPE AND SCREEN
ABO/RH(D): O POS
Antibody Screen: NEGATIVE
Unit division: 0

## 2020-04-08 LAB — COMPREHENSIVE METABOLIC PANEL
ALT: 14 U/L (ref 0–44)
AST: 25 U/L (ref 15–41)
Albumin: 3.7 g/dL (ref 3.5–5.0)
Alkaline Phosphatase: 44 U/L (ref 38–126)
Anion gap: 12 (ref 5–15)
BUN: 10 mg/dL (ref 6–20)
CO2: 24 mmol/L (ref 22–32)
Calcium: 9 mg/dL (ref 8.9–10.3)
Chloride: 103 mmol/L (ref 98–111)
Creatinine, Ser: 1.24 mg/dL (ref 0.61–1.24)
GFR calc Af Amer: >60 mL/min (ref >60)
GFR calc non Af Amer: >60 mL/min (ref >60)
Glucose, Bld: 124 mg/dL — ABNORMAL HIGH (ref 70–99)
Potassium: 3.5 mmol/L (ref 3.5–5.1)
Sodium: 139 mmol/L (ref 135–145)
Total Bilirubin: 0.5 mg/dL (ref 0.3–1.2)
Total Protein: 7 g/dL (ref 6.5–8.1)

## 2020-04-08 LAB — HIV ANTIBODY (ROUTINE TESTING W REFLEX): HIV Screen 4th Generation wRfx: NONREACTIVE

## 2020-04-08 LAB — SURGICAL PCR SCREEN
MRSA, PCR: NEGATIVE
Staphylococcus aureus: NEGATIVE

## 2020-04-08 LAB — BLOOD PRODUCT ORDER (VERBAL) VERIFICATION

## 2020-04-08 LAB — BPAM RBC
Blood Product Expiration Date: 202109232359
ISSUE DATE / TIME: 202109012309
Unit Type and Rh: 5100

## 2020-04-08 LAB — LACTIC ACID, PLASMA: Lactic Acid, Venous: 3.2 mmol/L (ref 0.5–1.9)

## 2020-04-08 LAB — ABO/RH: ABO/RH(D): O POS

## 2020-04-08 LAB — SARS CORONAVIRUS 2 BY RT PCR (HOSPITAL ORDER, PERFORMED IN ~~LOC~~ HOSPITAL LAB): SARS Coronavirus 2: NEGATIVE

## 2020-04-08 SURGERY — IRRIGATION AND DEBRIDEMENT EXTREMITY
Anesthesia: General | Site: Toe

## 2020-04-08 MED ORDER — ROCURONIUM BROMIDE 10 MG/ML (PF) SYRINGE
PREFILLED_SYRINGE | INTRAVENOUS | Status: AC
  Filled 2020-04-08: qty 20

## 2020-04-08 MED ORDER — DEXMEDETOMIDINE (PRECEDEX) IN NS 20 MCG/5ML (4 MCG/ML) IV SYRINGE
PREFILLED_SYRINGE | INTRAVENOUS | Status: AC
  Filled 2020-04-08: qty 5

## 2020-04-08 MED ORDER — ONDANSETRON HCL 4 MG/2ML IJ SOLN
INTRAMUSCULAR | Status: AC
  Filled 2020-04-08: qty 2

## 2020-04-08 MED ORDER — FENTANYL CITRATE (PF) 250 MCG/5ML IJ SOLN
INTRAMUSCULAR | Status: AC
  Filled 2020-04-08: qty 5

## 2020-04-08 MED ORDER — 0.9 % SODIUM CHLORIDE (POUR BTL) OPTIME
TOPICAL | Status: DC | PRN
  Administered 2020-04-08: 1000 mL

## 2020-04-08 MED ORDER — ESMOLOL HCL 100 MG/10ML IV SOLN
INTRAVENOUS | Status: DC | PRN
  Administered 2020-04-08: 30 mg via INTRAVENOUS

## 2020-04-08 MED ORDER — MIDAZOLAM HCL 2 MG/2ML IJ SOLN
INTRAMUSCULAR | Status: AC
  Filled 2020-04-08: qty 2

## 2020-04-08 MED ORDER — MORPHINE SULFATE (PF) 2 MG/ML IV SOLN
2.0000 mg | INTRAVENOUS | Status: DC | PRN
  Administered 2020-04-08: 4 mg via INTRAVENOUS
  Filled 2020-04-08: qty 2

## 2020-04-08 MED ORDER — SODIUM CHLORIDE 0.9 % IV SOLN
2.0000 g | INTRAVENOUS | Status: DC
  Administered 2020-04-08: 2 g via INTRAVENOUS
  Filled 2020-04-08: qty 2

## 2020-04-08 MED ORDER — CEFAZOLIN SODIUM-DEXTROSE 2-4 GM/100ML-% IV SOLN: 2.0000 g | Freq: Three times a day (TID) | INTRAVENOUS | Status: DC

## 2020-04-08 MED ORDER — PROPOFOL 10 MG/ML IV BOLUS
INTRAVENOUS | Status: AC
  Filled 2020-04-08: qty 20

## 2020-04-08 MED ORDER — CHLORHEXIDINE GLUCONATE 0.12 % MT SOLN: 15.0000 mL | Freq: Once | OROMUCOSAL | Status: AC

## 2020-04-08 MED ORDER — CHLORHEXIDINE GLUCONATE CLOTH 2 % EX PADS
6.0000 | MEDICATED_PAD | Freq: Every day | CUTANEOUS | Status: DC
  Administered 2020-04-08 – 2020-04-11 (×4): 6 via TOPICAL

## 2020-04-08 MED ORDER — CHLORHEXIDINE GLUCONATE 0.12 % MT SOLN
OROMUCOSAL | Status: AC
  Administered 2020-04-08: 15 mL via OROMUCOSAL
  Filled 2020-04-08: qty 15

## 2020-04-08 MED ORDER — ACETAMINOPHEN 325 MG PO TABS
650.0000 mg | ORAL_TABLET | Freq: Four times a day (QID) | ORAL | Status: DC
  Administered 2020-04-08 – 2020-04-11 (×11): 650 mg via ORAL
  Filled 2020-04-08 (×12): qty 2

## 2020-04-08 MED ORDER — LIDOCAINE 2% (20 MG/ML) 5 ML SYRINGE
INTRAMUSCULAR | Status: AC
  Filled 2020-04-08: qty 5

## 2020-04-08 MED ORDER — PROPOFOL 10 MG/ML IV BOLUS
INTRAVENOUS | Status: DC | PRN
  Administered 2020-04-08: 200 mg via INTRAVENOUS
  Administered 2020-04-08: 40 mg via INTRAVENOUS

## 2020-04-08 MED ORDER — SODIUM CHLORIDE 0.9 % IR SOLN
Status: DC | PRN
  Administered 2020-04-08 (×4): 3000 mL

## 2020-04-08 MED ORDER — DEXMEDETOMIDINE (PRECEDEX) IN NS 20 MCG/5ML (4 MCG/ML) IV SYRINGE
PREFILLED_SYRINGE | INTRAVENOUS | Status: DC | PRN
  Administered 2020-04-08 (×2): 4 ug via INTRAVENOUS

## 2020-04-08 MED ORDER — LIDOCAINE 2% (20 MG/ML) 5 ML SYRINGE
INTRAMUSCULAR | Status: DC | PRN
  Administered 2020-04-08: 100 mg via INTRAVENOUS

## 2020-04-08 MED ORDER — ROCURONIUM BROMIDE 10 MG/ML (PF) SYRINGE
PREFILLED_SYRINGE | INTRAVENOUS | Status: DC | PRN
  Administered 2020-04-08: 20 mg via INTRAVENOUS
  Administered 2020-04-08: 10 mg via INTRAVENOUS
  Administered 2020-04-08: 30 mg via INTRAVENOUS
  Administered 2020-04-08: 90 mg via INTRAVENOUS

## 2020-04-08 MED ORDER — ONDANSETRON HCL 4 MG/2ML IJ SOLN
4.0000 mg | Freq: Four times a day (QID) | INTRAMUSCULAR | Status: DC | PRN
  Administered 2020-04-08: 4 mg via INTRAVENOUS
  Filled 2020-04-08: qty 2

## 2020-04-08 MED ORDER — POTASSIUM CHLORIDE IN NACL 20-0.9 MEQ/L-% IV SOLN
INTRAVENOUS | Status: DC
  Filled 2020-04-08 (×4): qty 1000

## 2020-04-08 MED ORDER — OXYCODONE HCL 5 MG PO TABS
5.0000 mg | ORAL_TABLET | ORAL | Status: DC | PRN
  Administered 2020-04-09 (×4): 10 mg via ORAL
  Filled 2020-04-08 (×5): qty 2

## 2020-04-08 MED ORDER — DEXAMETHASONE SODIUM PHOSPHATE 10 MG/ML IJ SOLN
INTRAMUSCULAR | Status: AC
  Filled 2020-04-08: qty 1

## 2020-04-08 MED ORDER — FENTANYL CITRATE (PF) 250 MCG/5ML IJ SOLN
INTRAMUSCULAR | Status: DC | PRN
Start: 2020-04-08 — End: 2020-04-08
  Administered 2020-04-08 (×2): 100 ug via INTRAVENOUS
  Administered 2020-04-08: 50 ug via INTRAVENOUS
  Administered 2020-04-08: 100 ug via INTRAVENOUS
  Administered 2020-04-08: 150 ug via INTRAVENOUS
  Administered 2020-04-08: 100 ug via INTRAVENOUS
  Administered 2020-04-08: 150 ug via INTRAVENOUS

## 2020-04-08 MED ORDER — SODIUM CHLORIDE 0.9 % IV SOLN
2.0000 g | INTRAVENOUS | Status: AC
  Administered 2020-04-09 – 2020-04-11 (×3): 2 g via INTRAVENOUS
  Filled 2020-04-08 (×4): qty 20

## 2020-04-08 MED ORDER — FENTANYL CITRATE (PF) 100 MCG/2ML IJ SOLN
75.0000 ug | Freq: Once | INTRAMUSCULAR | Status: AC
  Administered 2020-04-08: 75 ug via INTRAVENOUS
  Filled 2020-04-08: qty 2

## 2020-04-08 MED ORDER — METHOCARBAMOL 1000 MG/10ML IJ SOLN
1000.0000 mg | Freq: Three times a day (TID) | INTRAVENOUS | Status: DC | PRN
  Filled 2020-04-08 (×2): qty 10

## 2020-04-08 MED ORDER — DEXAMETHASONE SODIUM PHOSPHATE 10 MG/ML IJ SOLN
INTRAMUSCULAR | Status: DC | PRN
  Administered 2020-04-08: 5 mg via INTRAVENOUS

## 2020-04-08 MED ORDER — LACTATED RINGERS IV SOLN: INTRAVENOUS | Status: DC

## 2020-04-08 MED ORDER — PROMETHAZINE HCL 25 MG/ML IJ SOLN: 6.2500 mg | INTRAMUSCULAR | Status: DC | PRN

## 2020-04-08 MED ORDER — HYDROMORPHONE HCL 1 MG/ML IJ SOLN: 0.2500 mg | INTRAMUSCULAR | Status: DC | PRN

## 2020-04-08 MED ORDER — ONDANSETRON 4 MG PO TBDP: 4.0000 mg | ORAL_TABLET | Freq: Four times a day (QID) | ORAL | Status: DC | PRN

## 2020-04-08 MED ORDER — MIDAZOLAM HCL 5 MG/5ML IJ SOLN
INTRAMUSCULAR | Status: DC | PRN
  Administered 2020-04-08: 2 mg via INTRAVENOUS

## 2020-04-08 MED ORDER — FENTANYL CITRATE (PF) 250 MCG/5ML IJ SOLN
INTRAMUSCULAR | Status: AC
Start: 2020-04-08 — End: ?
  Filled 2020-04-08: qty 5

## 2020-04-08 SURGICAL SUPPLY — 81 items
BANDAGE ESMARK 6X9 LF (GAUZE/BANDAGES/DRESSINGS) IMPLANT
BNDG COHESIVE 4X5 TAN STRL (GAUZE/BANDAGES/DRESSINGS) ×6 IMPLANT
BNDG ELASTIC 4X5.8 VLCR STR LF (GAUZE/BANDAGES/DRESSINGS) ×6 IMPLANT
BNDG ELASTIC 6X5.8 VLCR STR LF (GAUZE/BANDAGES/DRESSINGS) IMPLANT
BNDG ESMARK 6X9 LF (GAUZE/BANDAGES/DRESSINGS)
BNDG GAUZE ELAST 4 BULKY (GAUZE/BANDAGES/DRESSINGS) ×12 IMPLANT
BRUSH SCRUB EZ PLAIN DRY (MISCELLANEOUS) ×12 IMPLANT
CANISTER SUCT 3000ML PPV (MISCELLANEOUS) ×6 IMPLANT
CANISTER WOUND CARE 500ML ATS (WOUND CARE) ×12 IMPLANT
CLOSURE WOUND 1/2 X4 (GAUZE/BANDAGES/DRESSINGS) ×1
COVER MAYO STAND STRL (DRAPES) ×6 IMPLANT
COVER SURGICAL LIGHT HANDLE (MISCELLANEOUS) ×6 IMPLANT
DRAIN PENROSE 1/4X12 LTX STRL (WOUND CARE) ×6 IMPLANT
DRAIN PENROSE 18X1/4 LTX STRL (DRAIN) ×6 IMPLANT
DRAPE BILATERAL LIMB T (DRAPES) ×6 IMPLANT
DRAPE C-ARM 42X72 X-RAY (DRAPES) ×6 IMPLANT
DRAPE C-ARMOR (DRAPES) IMPLANT
DRAPE HALF SHEET 40X57 (DRAPES) ×6 IMPLANT
DRAPE INCISE IOBAN 66X45 STRL (DRAPES) ×6 IMPLANT
DRAPE U-SHAPE 47X51 STRL (DRAPES) ×6 IMPLANT
DRESSING PREVENA PLUS CUSTOM (GAUZE/BANDAGES/DRESSINGS) ×4 IMPLANT
DRSG ADAPTIC 3X8 NADH LF (GAUZE/BANDAGES/DRESSINGS) ×6 IMPLANT
DRSG EMULSION OIL 3X3 NADH (GAUZE/BANDAGES/DRESSINGS) IMPLANT
DRSG MEPITEL 4X7.2 (GAUZE/BANDAGES/DRESSINGS) ×18 IMPLANT
DRSG PREVENA PLUS CUSTOM (GAUZE/BANDAGES/DRESSINGS) ×6
DRSG VAC ATS SM SENSATRAC (GAUZE/BANDAGES/DRESSINGS) ×6 IMPLANT
ELECT REM PT RETURN 9FT ADLT (ELECTROSURGICAL) ×12
ELECTRODE REM PT RTRN 9FT ADLT (ELECTROSURGICAL) ×8 IMPLANT
GAUZE 4X4 16PLY RFD (DISPOSABLE) ×30 IMPLANT
GAUZE SPONGE 4X4 12PLY STRL (GAUZE/BANDAGES/DRESSINGS) ×18 IMPLANT
GLOVE BIO SURGEON STRL SZ7.5 (GLOVE) ×6 IMPLANT
GLOVE BIO SURGEON STRL SZ8 (GLOVE) ×12 IMPLANT
GLOVE BIOGEL PI IND STRL 7.5 (GLOVE) ×4 IMPLANT
GLOVE BIOGEL PI IND STRL 8 (GLOVE) ×8 IMPLANT
GLOVE BIOGEL PI IND STRL 9 (GLOVE) ×4 IMPLANT
GLOVE BIOGEL PI INDICATOR 7.5 (GLOVE) ×2
GLOVE BIOGEL PI INDICATOR 8 (GLOVE) ×4
GLOVE BIOGEL PI INDICATOR 9 (GLOVE) ×2
GOWN STRL REUS W/ TWL LRG LVL3 (GOWN DISPOSABLE) ×12 IMPLANT
GOWN STRL REUS W/ TWL XL LVL3 (GOWN DISPOSABLE) ×8 IMPLANT
GOWN STRL REUS W/TWL LRG LVL3 (GOWN DISPOSABLE) ×6
GOWN STRL REUS W/TWL XL LVL3 (GOWN DISPOSABLE) ×4
HANDPIECE INTERPULSE COAX TIP (DISPOSABLE) ×2
K-WIRE SURGICAL 1.6X102 (WIRE) ×12 IMPLANT
KIT BASIN OR (CUSTOM PROCEDURE TRAY) ×12 IMPLANT
KIT DRSG PREVENA PLUS 7DAY 125 (MISCELLANEOUS) ×6 IMPLANT
KIT TURNOVER KIT B (KITS) ×12 IMPLANT
MANIFOLD NEPTUNE II (INSTRUMENTS) ×6 IMPLANT
NEEDLE HYPO 21X1.5 SAFETY (NEEDLE) IMPLANT
NS IRRIG 1000ML POUR BTL (IV SOLUTION) ×12 IMPLANT
PACK ORTHO EXTREMITY (CUSTOM PROCEDURE TRAY) ×6 IMPLANT
PAD ARMBOARD 7.5X6 YLW CONV (MISCELLANEOUS) ×24 IMPLANT
PAD CAST 4YDX4 CTTN HI CHSV (CAST SUPPLIES) ×4 IMPLANT
PADDING CAST COTTON 4X4 STRL (CAST SUPPLIES) ×2
PADDING CAST COTTON 6X4 STRL (CAST SUPPLIES) ×12 IMPLANT
SET HNDPC FAN SPRY TIP SCT (DISPOSABLE) ×4 IMPLANT
SOL PREP POV-IOD 4OZ 10% (MISCELLANEOUS) ×6 IMPLANT
SOL PREP PROV IODINE SCRUB 4OZ (MISCELLANEOUS) ×6 IMPLANT
SPONGE LAP 18X18 RF (DISPOSABLE) ×6 IMPLANT
STAPLER VISISTAT 35W (STAPLE) ×6 IMPLANT
STRIP CLOSURE SKIN 1/2X4 (GAUZE/BANDAGES/DRESSINGS) ×5 IMPLANT
SUCTION FRAZIER HANDLE 10FR (MISCELLANEOUS)
SUCTION TUBE FRAZIER 10FR DISP (MISCELLANEOUS) IMPLANT
SUT ETHILON 2 0 PSLX (SUTURE) ×18 IMPLANT
SUT ETHILON 3 0 PS 1 (SUTURE) IMPLANT
SUT ETHILON O TP 1 (SUTURE) ×6 IMPLANT
SUT FIBERWIRE #2 38 T-5 BLUE (SUTURE) ×6
SUT PDS AB 0 CT 36 (SUTURE) ×6 IMPLANT
SUT PDS AB 2-0 CT1 27 (SUTURE) ×12 IMPLANT
SUT VIC AB 2-0 CT1 27 (SUTURE) ×4
SUT VIC AB 2-0 CT1 TAPERPNT 27 (SUTURE) ×8 IMPLANT
SUT VIC AB 3-0 FS2 27 (SUTURE) ×6 IMPLANT
SUTURE FIBERWR #2 38 T-5 BLUE (SUTURE) ×4 IMPLANT
TOWEL GREEN STERILE (TOWEL DISPOSABLE) ×12 IMPLANT
TOWEL GREEN STERILE FF (TOWEL DISPOSABLE) ×12 IMPLANT
TRAP DIGIT (INSTRUMENTS) ×6 IMPLANT
TUBE CONNECTING 12'X1/4 (SUCTIONS) ×1
TUBE CONNECTING 12X1/4 (SUCTIONS) ×5 IMPLANT
UNDERPAD 30X36 HEAVY ABSORB (UNDERPADS AND DIAPERS) ×18 IMPLANT
WATER STERILE IRR 1000ML POUR (IV SOLUTION) ×6 IMPLANT
YANKAUER SUCT BULB TIP NO VENT (SUCTIONS) ×6 IMPLANT

## 2020-04-08 NOTE — Op Note (Signed)
  04/07/2020 - 04/08/2020  4:42 PM  PATIENT:  Jeremy Cline  34 y.o. male  PRE-OPERATIVE DIAGNOSIS:   Complex occipital scalp laceration  POST-OPERATIVE DIAGNOSIS:   Complex occipital scalp laceration  PROCEDURE:  Procedure(s): Irrigation and closure complex occipital scalp laceration 8 cm  SURGEON: Violeta Gelinas, MD  ASSISTANTS: None  ANESTHESIA:   general  EBL:  Total I/O In: 1449.6 [I.V.:1349.6; IV Piggyback:100] Out: 350 [Urine:350]  BLOOD ADMINISTERED:none  DRAINS: none   SPECIMEN:  No Specimen  DISPOSITION OF SPECIMEN:  N/A  COUNTS:  YES  DICTATION: .Dragon Dictation Informed consent was obtained.  The patient is on IV antibiotics.  He was asleep in the operating room at the completion of Dr. Magdalene Patricia procedure.  He was repositioned then his posterior scalp was prepped and draped in sterile fashion.  We did a timeout procedure.  I then copiously irrigated his wound.  I debrided some small portions of the skin which were jagged and appeared nonviable.  I then closed the wound with staples, 8cm.  It came together fairly well.  A bulky sterile dressing was applied.  All counts were correct.  He tolerated the procedure without apparent complication and was taken to recovery in stable condition. PATIENT DISPOSITION:  ICU - extubated and stable.   Delay start of Pharmacological VTE agent (>24hrs) due to surgical blood loss or risk of bleeding:  no  Violeta Gelinas, MD, MPH, FACS Pager: [phone]  9/2/20214:42 PM
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
## 2020-04-08 NOTE — Progress Notes (Signed)
RN traveled downstairs with patient for pre-op.
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
## 2020-04-08 NOTE — Anesthesia Procedure Notes (Signed)
Procedure Name: Intubation Date/Time: 04/08/2020 1:47 PM Performed by: Moshe Salisbury, CRNA Pre-anesthesia Checklist: Patient identified, Emergency Drugs available, Suction available and Patient being monitored Patient Re-evaluated:Patient Re-evaluated prior to induction Oxygen Delivery Method: Circle System Utilized Preoxygenation: Pre-oxygenation with 100% oxygen Induction Type: IV induction Ventilation: Mask ventilation without difficulty Laryngoscope Size: Mac and 4 Grade View: Grade I Tube type: Oral Tube size: 7.5 mm Number of attempts: 1 Airway Equipment and Method: Stylet Placement Confirmation: ETT inserted through vocal cords under direct vision,  positive ETCO2 and breath sounds checked- equal and bilateral Secured at: 21 cm Tube secured with: Tape Dental Injury: Teeth and Oropharynx as per pre-operative assessment

## 2020-04-08 NOTE — Anesthesia Preprocedure Evaluation (Addendum)
Anesthesia Evaluation  Patient identified by MRN, date of birth, ID band  Reviewed: Allergy & Precautions, NPO status , Patient's Chart, lab work & pertinent test results  Airway Mallampati: III  TM Distance: >3 FB Neck ROM: Full   Comment: Poor compliance due to pain Dental no notable dental hx.    Pulmonary neg pulmonary ROS, Patient abstained from smoking.,    breath sounds clear to auscultation + decreased breath sounds      Cardiovascular Exercise Tolerance: Good negative cardio ROS   Rhythm:Regular Rate:Tachycardia     Neuro/Psych negative neurological ROS  negative psych ROS   GI/Hepatic negative GI ROS, (+)     substance abuse  alcohol use,   Endo/Other  negative endocrine ROS  Renal/GU negative Renal ROS  negative genitourinary   Musculoskeletal negative musculoskeletal ROS (+)   Abdominal Normal abdominal exam  (+)   Peds negative pediatric ROS (+)  Hematology negative hematology ROS (+)   Anesthesia Other Findings Scalp laceration   Reproductive/Obstetrics                            Anesthesia Physical Anesthesia Plan  ASA: III  Anesthesia Plan: General   Post-op Pain Management:    Induction: Intravenous  PONV Risk Score and Plan: Midazolam and Ondansetron  Airway Management Planned: Oral ETT and Video Laryngoscope Planned  Additional Equipment:   Intra-op Plan:   Post-operative Plan: Extubation in OR  Informed Consent: I have reviewed the patients History and Physical, chart, labs and discussed the procedure including the risks, benefits and alternatives for the proposed anesthesia with the patient or authorized representative who has indicated his/her understanding and acceptance.     Dental advisory given  Plan Discussed with: CRNA and Anesthesiologist  Anesthesia Plan Comments: (Glidescope available. )       Anesthesia Quick Evaluation

## 2020-04-08 NOTE — Transfer of Care (Signed)
Immediate Anesthesia Transfer of Care Note  Patient: Navdeep Halt  Procedure(s) Performed: IRRIGATION AND DEBRIDEMENT EXTREMITY (Bilateral Ankle) APPLICATION OF WOUND VAC (Bilateral Ankle) CLOSED REDUCTION METATARSAL WITH PERCUTANEOUS PINNING (Left Toe) SCALP LACERATION REPAIR (N/A )  Patient Location: PACU  Anesthesia Type:General  Level of Consciousness: awake and patient cooperative  Airway & Oxygen Therapy: Patient Spontanous Breathing and Patient connected to nasal cannula oxygen  Post-op Assessment: Report given to RN, Post -op Vital signs reviewed and stable and Patient moving all extremities  Post vital signs: Reviewed and stable  Last Vitals:  Vitals Value Taken Time  BP 186/97 04/08/20 1700  Temp    Pulse 116 04/08/20 1704  Resp 15 04/08/20 1704  SpO2 99 % 04/08/20 1704  Vitals shown include unvalidated device data.  Last Pain:  Vitals:   04/08/20 1700  TempSrc:   PainSc: (P) 7       Patients Stated Pain Goal: 2 (04/08/20 1134)  Complications: No complications documented.
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
## 2020-04-09 ENCOUNTER — Encounter (HOSPITAL_COMMUNITY): Payer: Self-pay | Admitting: Orthopedic Surgery

## 2020-04-09 LAB — CBC
HCT: 34.1 % — ABNORMAL LOW (ref 39.0–52.0)
Hemoglobin: 11.5 g/dL — ABNORMAL LOW (ref 13.0–17.0)
MCH: 32.5 pg (ref 26.0–34.0)
MCHC: 33.7 g/dL (ref 30.0–36.0)
MCV: 96.3 fL (ref 80.0–100.0)
Platelets: 190 10*3/uL (ref 150–400)
RBC: 3.54 MIL/uL — ABNORMAL LOW (ref 4.22–5.81)
RDW: 13.8 % (ref 11.5–15.5)
WBC: 11.9 10*3/uL — ABNORMAL HIGH (ref 4.0–10.5)
nRBC: 0 % (ref 0.0–0.2)

## 2020-04-09 LAB — BASIC METABOLIC PANEL
Anion gap: 7 (ref 5–15)
BUN: 7 mg/dL (ref 6–20)
CO2: 25 mmol/L (ref 22–32)
Calcium: 9.1 mg/dL (ref 8.9–10.3)
Chloride: 104 mmol/L (ref 98–111)
Creatinine, Ser: 0.86 mg/dL (ref 0.61–1.24)
GFR calc Af Amer: >60 mL/min (ref >60)
GFR calc non Af Amer: >60 mL/min (ref >60)
Glucose, Bld: 117 mg/dL — ABNORMAL HIGH (ref 70–99)
Potassium: 4 mmol/L (ref 3.5–5.1)
Sodium: 136 mmol/L (ref 135–145)
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
## 2020-04-09 NOTE — TOC CAGE-AID Note (Signed)
Transition of Care [facility]) - CAGE-AID Screening   Patient Details  Name: Jeremy Cline MRN: [id number] Date of Birth: [birth year]  [facility] CM/SW Contact:    Emeterio Reeve, [place] Phone Number: 04/09/2020, 4:11 PM   Clinical Narrative:  CSW met with pt at bedside. CSW introduced self and explained her role at the hospital.  Pt reports alcohol use of 2-3 days a week. Pt reports not having any concerns with his alcohol use. Pt denies substance use. Pt declined resources at this time.   CAGE-AID Screening:    Have You Ever Felt You Ought to Cut Down on Your Drinking or Drug Use?: No Have People Annoyed You By Critizing Your Drinking Or Drug Use?: No Have You Felt Bad Or Guilty About Your Drinking Or Drug Use?: No Have You Ever Had a Drink or Used Drugs First Thing In The Morning to Steady Your Nerves or to Get Rid of a Hangover?: No CAGE-AID Score: 0  Substance Abuse Education Offered: Yes    Blima Ledger, [place] Social Worker [phone]

## 2020-04-09 NOTE — Plan of Care (Signed)
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
## 2020-04-10 LAB — BASIC METABOLIC PANEL
Anion gap: 10 (ref 5–15)
BUN: <5 mg/dL — ABNORMAL LOW (ref 6–20)
CO2: 25 mmol/L (ref 22–32)
Calcium: 9.2 mg/dL (ref 8.9–10.3)
Chloride: 100 mmol/L (ref 98–111)
Creatinine, Ser: 1 mg/dL (ref 0.61–1.24)
GFR calc Af Amer: >60 mL/min (ref >60)
GFR calc non Af Amer: >60 mL/min (ref >60)
Glucose, Bld: 103 mg/dL — ABNORMAL HIGH (ref 70–99)
Potassium: 3.8 mmol/L (ref 3.5–5.1)
Sodium: 135 mmol/L (ref 135–145)

## 2020-04-10 LAB — CBC
HCT: 33.1 % — ABNORMAL LOW (ref 39.0–52.0)
Hemoglobin: 10.9 g/dL — ABNORMAL LOW (ref 13.0–17.0)
MCH: 31.7 pg (ref 26.0–34.0)
MCHC: 32.9 g/dL (ref 30.0–36.0)
MCV: 96.2 fL (ref 80.0–100.0)
Platelets: 176 10*3/uL (ref 150–400)
RBC: 3.44 MIL/uL — ABNORMAL LOW (ref 4.22–5.81)
RDW: 13.5 % (ref 11.5–15.5)
WBC: 9.9 10*3/uL (ref 4.0–10.5)
nRBC: 0 % (ref 0.0–0.2)

## 2020-04-10 LAB — VITAMIN D 25 HYDROXY (VIT D DEFICIENCY, FRACTURES): Vit D, 25-Hydroxy: 7.77 ng/mL — ABNORMAL LOW (ref 30–100)

## 2020-04-10 MED ORDER — DOCUSATE SODIUM 100 MG PO CAPS
100.0000 mg | ORAL_CAPSULE | Freq: Two times a day (BID) | ORAL | Status: DC
  Administered 2020-04-10: 100 mg via ORAL
  Filled 2020-04-10 (×2): qty 1

## 2020-04-10 NOTE — Progress Notes (Signed)
Subjective: The patient is alert and pleasant and in no apparent distress.  Objective: Vital signs in last 24 hours: Temp:  [97.9 F (36.6 C)-98.7 F (37.1 C)] 98.3 F (36.8 C) (09/04 0817) Pulse Rate:  [89-108] 108 (09/04 0817) Resp:  [13-17] 16 (09/04 0817) BP: (126-149)/(70-88) 133/88 (09/04 0817) SpO2:  [96 %-100 %] 100 % (09/04 0817) Estimated body mass index is 20.54 kg/m as calculated from the following:   Height as of this encounter: 6\' 2"  (1.88 m).   Weight as of this encounter: 72.6 kg.   Intake/Output from previous day: 09/03 0701 - 09/04 0700 In: 480 [P.O.:480] Out: 1950 [Urine:1875; Drains:75] Intake/Output this shift: Total I/O In: -  Out: 400 [Urine:400]  Physical exam the patient is alert and oriented.  He is moving all 4 extremities well.  There is no signs of CSF otorrhea or rhinorrhea.  Lab Results: Recent Labs    04/09/20 0650 04/10/20 0423  WBC 11.9* 9.9  HGB 11.5* 10.9*  HCT 34.1* 33.1*  PLT 190 176   BMET Recent Labs    04/09/20 0650 04/10/20 0423  NA 136 135  K 4.0 3.8  CL 104 100  CO2 25 25  GLUCOSE 117* 103*  BUN 7 <5*  CREATININE 0.86 1.00  CALCIUM 9.1 9.2    Studies/Results: DG Ankle Complete Left  Result Date: 04/08/2020 CLINICAL DATA:  Irrigation and debridement, pinning of the first metatarsal phalangeal joint EXAM: LEFT ANKLE COMPLETE - 3+ VIEW; LEFT FOOT - COMPLETE 3+ VIEW COMPARISON:  04/08/2020 FINDINGS: Left foot: Frontal, oblique, lateral views demonstrate 2 pins traversing the first metatarsophalangeal joint, with reduction of the dislocation seen previously. Fractured lateral sesamoid bone again noted, with near anatomic alignment. The comminuted fractures of the second and third metatarsals heads as well as the lateral aspect of the base of the first distal phalanx unchanged. Diffuse soft tissue edema. Evaluation of bony details limited by casting material. Left ankle: Frontal, oblique, lateral views demonstrate cast  material which limits evaluation of the underlying bony structures and soft tissues. The lateral malleolar fracture is in anatomic alignment. Joint spaces are well preserved. IMPRESSION: 1. Pinning of the first metatarsophalangeal joint, with reduction of dislocation seen previously. 2. Stable fractures of the lateral sesamoid adjacent to the first metatarsal, first distal phalanx, and second and third metatarsal heads. 3. Anatomic alignment of the lateral malleolar fracture. Electronically Signed   By: 06/08/2020 M.D.   On: 04/08/2020 21:56   DG Os Calcis Right  Result Date: 04/08/2020 CLINICAL DATA:  Irrigation and debridement EXAM: RIGHT OS CALCIS - 2+ VIEW COMPARISON:  04/08/2020 FINDINGS: Lateral and Harris views of the right calcaneus are obtained. Packing material is seen within the soft tissue overlying the plantar margin of the calcaneus, with overlying wound VAC device. Subcutaneous gas consistent with previous laceration and interval irrigation/debridement. No acute fracture. IMPRESSION: 1. Postsurgical changes as above, with indwelling packing material and wound VAC device. Electronically Signed   By: 06/08/2020 M.D.   On: 04/08/2020 21:58   DG Foot Complete Left  Result Date: 04/08/2020 CLINICAL DATA:  Irrigation and debridement, pinning of the first metatarsal phalangeal joint EXAM: LEFT ANKLE COMPLETE - 3+ VIEW; LEFT FOOT - COMPLETE 3+ VIEW COMPARISON:  04/08/2020 FINDINGS: Left foot: Frontal, oblique, lateral views demonstrate 2 pins traversing the first metatarsophalangeal joint, with reduction of the dislocation seen previously. Fractured lateral sesamoid bone again noted, with near anatomic alignment. The comminuted fractures of the second and third metatarsals  heads as well as the lateral aspect of the base of the first distal phalanx unchanged. Diffuse soft tissue edema. Evaluation of bony details limited by casting material. Left ankle: Frontal, oblique, lateral views demonstrate  cast material which limits evaluation of the underlying bony structures and soft tissues. The lateral malleolar fracture is in anatomic alignment. Joint spaces are well preserved. IMPRESSION: 1. Pinning of the first metatarsophalangeal joint, with reduction of dislocation seen previously. 2. Stable fractures of the lateral sesamoid adjacent to the first metatarsal, first distal phalanx, and second and third metatarsal heads. 3. Anatomic alignment of the lateral malleolar fracture. Electronically Signed   By: Sharlet Salina M.D.   On: 04/08/2020 21:56   DG Foot Complete Left  Result Date: 04/08/2020 CLINICAL DATA:  Fracture. Irrigation and debridement. EXAM: DG C-ARM 1-60 MIN; LEFT FOOT - COMPLETE 3+ VIEW FLUOROSCOPY TIME:  Fluoroscopy Time:  33 seconds Radiation Exposure Index (if provided by the fluoroscopic device): 34 mGy Number of Acquired Spot Images: 9 COMPARISON:  Preoperative radiograph 04/08/2020 FINDINGS: Nine fluoroscopic spot views in the operating room provided. Improved alignment of the first ray at the metatarsal phalangeal joint. Small fracture fragments are not well seen on these fluoroscopic spot views. There is K-wire fixation of the metatarsal phalangeal joint. Total fluoroscopy time 33 seconds. IMPRESSION: Intraoperative fluoroscopy during first ray surgery. Electronically Signed   By: Narda Rutherford M.D.   On: 04/08/2020 16:49   DG C-Arm 1-60 Min  Result Date: 04/08/2020 CLINICAL DATA:  Fracture. Irrigation and debridement. EXAM: DG C-ARM 1-60 MIN; LEFT FOOT - COMPLETE 3+ VIEW FLUOROSCOPY TIME:  Fluoroscopy Time:  33 seconds Radiation Exposure Index (if provided by the fluoroscopic device): 34 mGy Number of Acquired Spot Images: 9 COMPARISON:  Preoperative radiograph 04/08/2020 FINDINGS: Nine fluoroscopic spot views in the operating room provided. Improved alignment of the first ray at the metatarsal phalangeal joint. Small fracture fragments are not well seen on these fluoroscopic spot  views. There is K-wire fixation of the metatarsal phalangeal joint. Total fluoroscopy time 33 seconds. IMPRESSION: Intraoperative fluoroscopy during first ray surgery. Electronically Signed   By: Narda Rutherford M.D.   On: 04/08/2020 16:49    Assessment/Plan: Skull fractures: The patient is doing well well neurologically.  Please call if we can be of further assistance.  LOS: 2 days     Jeremy Cline 04/10/2020, 10:57 AM
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
## 2020-04-11 LAB — CBC
HCT: 33.8 % — ABNORMAL LOW (ref 39.0–52.0)
Hemoglobin: 11.5 g/dL — ABNORMAL LOW (ref 13.0–17.0)
MCH: 32.4 pg (ref 26.0–34.0)
MCHC: 34 g/dL (ref 30.0–36.0)
MCV: 95.2 fL (ref 80.0–100.0)
Platelets: 234 10*3/uL (ref 150–400)
RBC: 3.55 MIL/uL — ABNORMAL LOW (ref 4.22–5.81)
RDW: 12.8 % (ref 11.5–15.5)
WBC: 7.7 10*3/uL (ref 4.0–10.5)
nRBC: 0 % (ref 0.0–0.2)

## 2020-04-11 LAB — BASIC METABOLIC PANEL
Anion gap: 11 (ref 5–15)
BUN: 5 mg/dL — ABNORMAL LOW (ref 6–20)
CO2: 26 mmol/L (ref 22–32)
Calcium: 9.3 mg/dL (ref 8.9–10.3)
Chloride: 100 mmol/L (ref 98–111)
Creatinine, Ser: 0.79 mg/dL (ref 0.61–1.24)
GFR calc Af Amer: >60 mL/min (ref >60)
GFR calc non Af Amer: >60 mL/min (ref >60)
Glucose, Bld: 113 mg/dL — ABNORMAL HIGH (ref 70–99)
Potassium: 3.5 mmol/L (ref 3.5–5.1)
Sodium: 137 mmol/L (ref 135–145)

## 2020-04-11 MED ORDER — OXYCODONE HCL 5 MG PO TABS
5.0000 mg | ORAL_TABLET | ORAL | 0 refills | Status: DC | PRN
Start: 2020-04-11 — End: 2020-06-11

## 2020-04-11 MED ORDER — ACETAMINOPHEN 325 MG PO TABS
650.0000 mg | ORAL_TABLET | Freq: Four times a day (QID) | ORAL | 2 refills | Status: DC
Start: 2020-04-11 — End: 2020-06-11

## 2020-04-11 MED ORDER — DOCUSATE SODIUM 100 MG PO CAPS: 100.0000 mg | ORAL_CAPSULE | Freq: Two times a day (BID) | ORAL | 0 refills | Status: DC

## 2020-04-11 MED ORDER — ENOXAPARIN SODIUM 30 MG/0.3ML ~~LOC~~ SOLN
30.0000 mg | Freq: Two times a day (BID) | SUBCUTANEOUS | Status: DC
  Administered 2020-04-11: 30 mg via SUBCUTANEOUS
  Filled 2020-04-11: qty 0.3

## 2020-04-11 MED ORDER — POTASSIUM CHLORIDE 20 MEQ PO PACK
40.0000 meq | PACK | Freq: Once | ORAL | Status: AC
  Administered 2020-04-11: 40 meq via ORAL
  Filled 2020-04-11: qty 2
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
## 2020-04-11 NOTE — Progress Notes (Signed)
Ortho Trauma Note  Patient to discharge today. RLE wound vac taken down. Wound slightly macerated but healthy. Penrose removed. Adaptic, 4x4s and ACE wrap placed. LLE changed out to [employer].  Maintain NWB BLE Follow up this Wednesday-he must come Keep dressings in placed until follow up appointment Okay to dc from ortho standpoint.  Roby Lofts, MD [facility] [phone] (office) [email or web address]
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
## 2020-04-15 NOTE — Anesthesia Postprocedure Evaluation (Signed)
Anesthesia Post Note  Patient: Jeremy Cline  Procedure(s) Performed: IRRIGATION AND DEBRIDEMENT EXTREMITY (Bilateral Ankle) APPLICATION OF WOUND VAC (Bilateral Ankle) CLOSED REDUCTION METATARSAL WITH PERCUTANEOUS PINNING (Left Toe) SCALP LACERATION REPAIR (N/A )     Patient location during evaluation: PACU Anesthesia Type: General Level of consciousness: patient cooperative and awake Pain management: pain level controlled Vital Signs Assessment: post-procedure vital signs reviewed and stable Respiratory status: spontaneous breathing, nonlabored ventilation, respiratory function stable and patient connected to nasal cannula oxygen Cardiovascular status: blood pressure returned to baseline and stable Postop Assessment: no apparent nausea or vomiting Anesthetic complications: no   No complications documented.  Last Vitals:  Vitals:   04/11/20 0000 04/11/20 1055  BP: (!) 151/83 (!) 141/81  Pulse:  88  Resp: 18 20  Temp: 36.8 C 36.8 C  SpO2: 100% 100%    Last Pain:  Vitals:   04/11/20 1055  TempSrc: Oral  PainSc:                  Desi Carby

## 2020-04-22 ENCOUNTER — Other Ambulatory Visit: Payer: Self-pay | Admitting: Physician Assistant

## 2020-04-22 DIAGNOSIS — H9192 Unspecified hearing loss, left ear: Secondary | ICD-10-CM
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
## 2020-05-03 ENCOUNTER — Encounter: Payer: Self-pay | Admitting: Orthopedic Surgery

## 2020-05-03 ENCOUNTER — Ambulatory Visit (INDEPENDENT_AMBULATORY_CARE_PROVIDER_SITE_OTHER): Payer: Self-pay | Admitting: Orthopedic Surgery

## 2020-05-03 ENCOUNTER — Ambulatory Visit: Payer: Self-pay | Admitting: Orthopedic Surgery

## 2020-05-03 DIAGNOSIS — L97411 Non-pressure chronic ulcer of right heel and midfoot limited to breakdown of skin: Secondary | ICD-10-CM
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
## 2020-05-04 ENCOUNTER — Encounter (HOSPITAL_COMMUNITY): Payer: Self-pay

## 2020-05-10 ENCOUNTER — Ambulatory Visit (INDEPENDENT_AMBULATORY_CARE_PROVIDER_SITE_OTHER): Payer: Self-pay | Admitting: Physician Assistant

## 2020-05-10 ENCOUNTER — Encounter: Payer: Self-pay | Admitting: Physician Assistant

## 2020-05-10 VITALS — Ht 74.0 in | Wt 160.0 lb

## 2020-05-10 DIAGNOSIS — L97411 Non-pressure chronic ulcer of right heel and midfoot limited to breakdown of skin: Secondary | ICD-10-CM

## 2020-05-10 NOTE — Progress Notes (Signed)
Office Visit Note   Patient: Jeremy Cline           Date of Birth: November 10, 1985           MRN: [id number] Visit Date: 05/10/2020              Requested by: No referring provider defined for this encounter. PCP: Patient, No Pcp Per  Chief Complaint  Patient presents with  . Right Foot - Follow-up    Heel ulcer      HPI: This is a pleasant 34 year old gentleman who follows up today for his right heel ulcer.  He does say it has been feeling better.  He has been wearing a dressing such as silver cell dressing though he is unsure exactly what kind he has been covering this with a compression stocking to them by Dr. Lajoyce Corners.  He has been wearing the [facility] boot as ordered he did not pick up his pain medication and prefers just to take Tylenol  Assessment & Plan: Visit Diagnoses: No diagnosis found.  Plan: Continue with current treatment we will follow up in 2 weeks or sooner if things change for the worse  Follow-Up Instructions: No follow-ups on file.   Ortho Exam  Patient is alert, oriented, no adenopathy, well-dressed, normal affect, normal respiratory effort. Right heel: Compared to pictures of previous about the same circumference with bleeding tissue some central fibrinous tissue no surrounding cellulitis minimally tender to palpation swelling is well controlled  Imaging: No results found. No images are attached to the encounter.  Labs: No results found for: HGBA1C, ESRSEDRATE, CRP, LABURIC, REPTSTATUS, GRAMSTAIN, CULT, LABORGA   Lab Results  Component Value Date   ALBUMIN 3.7 04/07/2020    No results found for: MG Lab Results  Component Value Date   VD25OH 7.77 (L) 04/10/2020    No results found for: PREALBUMIN CBC EXTENDED Latest Ref Rng & Units 04/11/2020 04/10/2020 04/09/2020  WBC 4.0 - 10.5 K/uL 7.7 9.9 11.9(H)  RBC 4.22 - 5.81 MIL/uL 3.55(L) 3.44(L) 3.54(L)  HGB 13.0 - 17.0 g/dL 11.5(L) 10.9(L) 11.5(L)  HCT 39 - 52 % 33.8(L) 33.1(L) 34.1(L)  PLT 150 - 400 K/uL  234 176 190     Body mass index is 20.54 kg/m.  Orders:  No orders of the defined types were placed in this encounter.  No orders of the defined types were placed in this encounter.    Procedures: No procedures performed  Clinical Data: No additional findings.  ROS:  All other systems negative, except as noted in the HPI. Review of Systems  Objective: Vital Signs: Ht 6\' 2"  (1.88 m)   Wt 160 lb (72.6 kg)   BMI 20.54 kg/m   Specialty Comments:  No specialty comments available.  PMFS History: Patient Active Problem List   Diagnosis Date Noted  . Skull fracture (HCC) 04/08/2020   Past Medical History:  Diagnosis Date  . Asthma    Pt stated "as a child"    Family History  Problem Relation Age of Onset  . Diabetes Mother     Past Surgical History:  Procedure Laterality Date  . APPLICATION OF WOUND VAC Bilateral 04/08/2020   Procedure: APPLICATION OF WOUND VAC;  Surgeon: 06/08/2020, MD;  Location: [facility] OR;  Service: Orthopedics;  Laterality: Bilateral;  . CLOSED REDUCTION METATARSAL Left 04/08/2020   Procedure: CLOSED REDUCTION METATARSAL WITH PERCUTANEOUS PINNING;  Surgeon: 06/08/2020, MD;  Location: [facility] OR;  Service: Orthopedics;  Laterality: Left;  . I & D  EXTREMITY Bilateral 04/08/2020   Procedure: IRRIGATION AND DEBRIDEMENT EXTREMITY;  Surgeon: Myrene Galas, MD;  Location: [facility] OR;  Service: Orthopedics;  Laterality: Bilateral;  . SCALP LACERATION REPAIR N/A 04/08/2020   Procedure: SCALP LACERATION REPAIR;  Surgeon: Violeta Gelinas, MD;  Location: [facility] OR;  Service: General;  Laterality: N/A;   Social History   Occupational History  . Not on file  Tobacco Use  . Smoking status: Never Smoker  . Smokeless tobacco: Never Used  Vaping Use  . Vaping Use: Never used  Substance and Sexual Activity  . Alcohol use: Yes  . Drug use: Yes    Types: Marijuana  . Sexual activity: Not on file
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
## 2020-05-24 ENCOUNTER — Ambulatory Visit (INDEPENDENT_AMBULATORY_CARE_PROVIDER_SITE_OTHER): Payer: Self-pay | Admitting: Physician Assistant

## 2020-05-24 ENCOUNTER — Encounter: Payer: Self-pay | Admitting: Physician Assistant

## 2020-05-24 VITALS — Ht 74.0 in | Wt 160.0 lb

## 2020-05-24 DIAGNOSIS — L97411 Non-pressure chronic ulcer of right heel and midfoot limited to breakdown of skin: Secondary | ICD-10-CM

## 2020-05-24 NOTE — Progress Notes (Signed)
Office Visit Note   Patient: Jeremy Cline           Date of Birth: 1986/05/11           MRN: [id number] Visit Date: 05/24/2020              Requested by: No referring provider defined for this encounter. PCP: Patient, No Pcp Per  Chief Complaint  Patient presents with  . Right Foot - Follow-up      HPI: Patient presents in follow-up today for his right heel ulcer.  He has been on antibiotics and is ambulating with crutches.  He is wearing his compression sock and changes it daily he feels he is doing much better and pain has significantly decreased Assessment & Plan: Visit Diagnoses: No diagnosis found.  Plan: Heel ulcer does look healthier.  He will follow-up in 2 weeks should continue to use compression stocking  Follow-Up Instructions: No follow-ups on file.   Ortho Exam  Patient is alert, oriented, no adenopathy, well-dressed, normal affect, normal respiratory effort. Focused examination of the right heel no cellulitis minimally tender to palpation he has not focal area of approximately 3 cm that does not probe deeply and has good vascular fibrous tissue.  No surrounding ascending cellulitis  Imaging: No results found. No images are attached to the encounter.  Labs: No results found for: HGBA1C, ESRSEDRATE, CRP, LABURIC, REPTSTATUS, GRAMSTAIN, CULT, LABORGA   Lab Results  Component Value Date   ALBUMIN 3.7 04/07/2020    No results found for: MG Lab Results  Component Value Date   VD25OH 7.77 (L) 04/10/2020    No results found for: PREALBUMIN CBC EXTENDED Latest Ref Rng & Units 04/11/2020 04/10/2020 04/09/2020  WBC 4.0 - 10.5 K/uL 7.7 9.9 11.9(H)  RBC 4.22 - 5.81 MIL/uL 3.55(L) 3.44(L) 3.54(L)  HGB 13.0 - 17.0 g/dL 11.5(L) 10.9(L) 11.5(L)  HCT 39 - 52 % 33.8(L) 33.1(L) 34.1(L)  PLT 150 - 400 K/uL 234 176 190     Body mass index is 20.54 kg/m.  Orders:  No orders of the defined types were placed in this encounter.  No orders of the defined types were  placed in this encounter.    Procedures: No procedures performed  Clinical Data: No additional findings.  ROS:  All other systems negative, except as noted in the HPI. Review of Systems  Objective: Vital Signs: Ht 6\' 2"  (1.88 m)   Wt 160 lb (72.6 kg)   BMI 20.54 kg/m   Specialty Comments:  No specialty comments available.  PMFS History: Patient Active Problem List   Diagnosis Date Noted  . Skull fracture (HCC) 04/08/2020   Past Medical History:  Diagnosis Date  . Asthma    Pt stated "as a child"    Family History  Problem Relation Age of Onset  . Diabetes Mother     Past Surgical History:  Procedure Laterality Date  . APPLICATION OF WOUND VAC Bilateral 04/08/2020   Procedure: APPLICATION OF WOUND VAC;  Surgeon: 06/08/2020, MD;  Location: [facility] OR;  Service: Orthopedics;  Laterality: Bilateral;  . CLOSED REDUCTION METATARSAL Left 04/08/2020   Procedure: CLOSED REDUCTION METATARSAL WITH PERCUTANEOUS PINNING;  Surgeon: 06/08/2020, MD;  Location: [facility] OR;  Service: Orthopedics;  Laterality: Left;  . I & D EXTREMITY Bilateral 04/08/2020   Procedure: IRRIGATION AND DEBRIDEMENT EXTREMITY;  Surgeon: 06/08/2020, MD;  [facility]: [facility] OR;  Service: Orthopedics;  Laterality: Bilateral;  . SCALP LACERATION REPAIR N/A 04/08/2020   Procedure: SCALP  LACERATION REPAIR;  Surgeon: Violeta Gelinas, MD;  Location: [facility];  Service: General;  Laterality: N/A;   Social History   Occupational History  . Not on file  Tobacco Use  . Smoking status: Never Smoker  . Smokeless tobacco: Never Used  Vaping Use  . Vaping Use: Never used  Substance and Sexual Activity  . Alcohol use: Yes  . Drug use: Yes    Types: Marijuana  . Sexual activity: Not on file

## 2020-06-07 ENCOUNTER — Ambulatory Visit: Payer: Self-pay | Admitting: Physician Assistant

## 2020-06-08 ENCOUNTER — Ambulatory Visit (INDEPENDENT_AMBULATORY_CARE_PROVIDER_SITE_OTHER): Payer: Self-pay

## 2020-06-08 ENCOUNTER — Ambulatory Visit (INDEPENDENT_AMBULATORY_CARE_PROVIDER_SITE_OTHER): Payer: Self-pay | Admitting: Orthopedic Surgery

## 2020-06-08 ENCOUNTER — Encounter: Payer: Self-pay | Admitting: Orthopedic Surgery

## 2020-06-08 DIAGNOSIS — M869 Osteomyelitis, unspecified: Secondary | ICD-10-CM

## 2020-06-08 DIAGNOSIS — M79672 Pain in left foot: Secondary | ICD-10-CM
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
## 2020-06-09 ENCOUNTER — Other Ambulatory Visit: Payer: Self-pay | Admitting: Physician Assistant

## 2020-06-10 ENCOUNTER — Other Ambulatory Visit: Payer: Self-pay

## 2020-06-10 ENCOUNTER — Encounter (HOSPITAL_COMMUNITY): Payer: Self-pay | Admitting: Orthopedic Surgery

## 2020-06-10 NOTE — Progress Notes (Signed)
Spoke with pt for pre-op call. Pt denies cardiac history, chest pain or sob. Pt states he is not diabetic. Pt states he is not taking any medications at this time  Pt unable to get Covid test today due to transportation issues. Covid test to be done day of surgery.  Requested Anesthesia PA to review recent EKG.

## 2020-06-11 ENCOUNTER — Encounter (HOSPITAL_COMMUNITY): Payer: Self-pay | Admitting: Orthopedic Surgery

## 2020-06-11 ENCOUNTER — Ambulatory Visit (HOSPITAL_COMMUNITY)
Admission: RE | Admit: 2020-06-11 | Discharge: 2020-06-11 | Disposition: A | Discharge status: Home / Self Care | Payer: Self-pay | Attending: Orthopedic Surgery | Admitting: Orthopedic Surgery

## 2020-06-11 ENCOUNTER — Ambulatory Visit (HOSPITAL_COMMUNITY): Payer: Self-pay | Admitting: Physician Assistant

## 2020-06-11 ENCOUNTER — Encounter (HOSPITAL_COMMUNITY)
Admission: RE | Disposition: A | Discharge status: Home / Self Care | Payer: Self-pay | Source: Home / Self Care | Attending: Orthopedic Surgery

## 2020-06-11 DIAGNOSIS — Z20822 Contact with and (suspected) exposure to covid-19: Secondary | ICD-10-CM | POA: Insufficient documentation

## 2020-06-11 DIAGNOSIS — M869 Osteomyelitis, unspecified: Secondary | ICD-10-CM

## 2020-06-11 DIAGNOSIS — L02612 Cutaneous abscess of left foot: Secondary | ICD-10-CM

## 2020-06-11 HISTORY — PX: AMPUTATION: SHX166

## 2020-06-11 LAB — COMPREHENSIVE METABOLIC PANEL
ALT: 19 U/L (ref 0–44)
AST: 37 U/L (ref 15–41)
Albumin: 3.5 g/dL (ref 3.5–5.0)
Alkaline Phosphatase: 60 U/L (ref 38–126)
Anion gap: 11 (ref 5–15)
BUN: 15 mg/dL (ref 6–20)
CO2: 23 mmol/L (ref 22–32)
Calcium: 9.7 mg/dL (ref 8.9–10.3)
Chloride: 102 mmol/L (ref 98–111)
Creatinine, Ser: 0.92 mg/dL (ref 0.61–1.24)
GFR, Estimated: >60 mL/min (ref >60)
Glucose, Bld: 93 mg/dL (ref 70–99)
Potassium: 5.1 mmol/L (ref 3.5–5.1)
Sodium: 136 mmol/L (ref 135–145)
Total Bilirubin: 1.1 mg/dL (ref 0.3–1.2)
Total Protein: 7.6 g/dL (ref 6.5–8.1)

## 2020-06-11 LAB — SARS CORONAVIRUS 2 BY RT PCR (HOSPITAL ORDER, PERFORMED IN ~~LOC~~ HOSPITAL LAB): SARS Coronavirus 2: NEGATIVE

## 2020-06-11 LAB — CBC
HCT: 42.1 % (ref 39.0–52.0)
Hemoglobin: 13.9 g/dL (ref 13.0–17.0)
MCH: 31 pg (ref 26.0–34.0)
MCHC: 33 g/dL (ref 30.0–36.0)
MCV: 94 fL (ref 80.0–100.0)
Platelets: 317 10*3/uL (ref 150–400)
RBC: 4.48 MIL/uL (ref 4.22–5.81)
RDW: 13.4 % (ref 11.5–15.5)
WBC: 6.3 10*3/uL (ref 4.0–10.5)
nRBC: 0 % (ref 0.0–0.2)

## 2020-06-11 SURGERY — AMPUTATION, FOOT, RAY
Anesthesia: General | Site: Foot | Laterality: Left

## 2020-06-11 MED ORDER — MIDAZOLAM HCL 5 MG/5ML IJ SOLN
INTRAMUSCULAR | Status: DC | PRN
  Administered 2020-06-11: 2 mg via INTRAVENOUS

## 2020-06-11 MED ORDER — DEXMEDETOMIDINE (PRECEDEX) IN NS 20 MCG/5ML (4 MCG/ML) IV SYRINGE
PREFILLED_SYRINGE | INTRAVENOUS | Status: AC
  Filled 2020-06-11: qty 5

## 2020-06-11 MED ORDER — 0.9 % SODIUM CHLORIDE (POUR BTL) OPTIME
TOPICAL | Status: DC | PRN
  Administered 2020-06-11: 1000 mL

## 2020-06-11 MED ORDER — OXYCODONE-ACETAMINOPHEN 5-325 MG PO TABS: 1.0000 | ORAL_TABLET | ORAL | 0 refills | Status: DC | PRN

## 2020-06-11 MED ORDER — CHLORHEXIDINE GLUCONATE 0.12 % MT SOLN
OROMUCOSAL | Status: AC
  Administered 2020-06-11: 15 mL via OROMUCOSAL
  Filled 2020-06-11: qty 15

## 2020-06-11 MED ORDER — OXYCODONE HCL 5 MG/5ML PO SOLN: 5.0000 mg | Freq: Once | ORAL | Status: AC | PRN

## 2020-06-11 MED ORDER — OXYCODONE HCL 5 MG PO TABS
ORAL_TABLET | ORAL | Status: AC
  Administered 2020-06-11: 5 mg via ORAL
  Filled 2020-06-11: qty 1

## 2020-06-11 MED ORDER — PROPOFOL 10 MG/ML IV BOLUS
INTRAVENOUS | Status: AC
  Filled 2020-06-11: qty 20

## 2020-06-11 MED ORDER — PROPOFOL 10 MG/ML IV BOLUS
INTRAVENOUS | Status: DC | PRN
  Administered 2020-06-11: 200 mg via INTRAVENOUS

## 2020-06-11 MED ORDER — KETOROLAC TROMETHAMINE 30 MG/ML IJ SOLN
INTRAMUSCULAR | Status: AC
  Administered 2020-06-11: 30 mg via INTRAVENOUS
  Filled 2020-06-11: qty 1

## 2020-06-11 MED ORDER — LACTATED RINGERS IV SOLN: INTRAVENOUS | Status: DC

## 2020-06-11 MED ORDER — CEFAZOLIN SODIUM-DEXTROSE 2-4 GM/100ML-% IV SOLN
INTRAVENOUS | Status: AC
  Filled 2020-06-11: qty 100

## 2020-06-11 MED ORDER — ONDANSETRON HCL 4 MG/2ML IJ SOLN
INTRAMUSCULAR | Status: DC | PRN
  Administered 2020-06-11: 4 mg via INTRAVENOUS

## 2020-06-11 MED ORDER — CHLORHEXIDINE GLUCONATE 0.12 % MT SOLN: 15.0000 mL | Freq: Once | OROMUCOSAL | Status: AC

## 2020-06-11 MED ORDER — ACETAMINOPHEN 325 MG PO TABS: 325.0000 mg | ORAL_TABLET | ORAL | Status: DC | PRN

## 2020-06-11 MED ORDER — FENTANYL CITRATE (PF) 100 MCG/2ML IJ SOLN: 25.0000 ug | INTRAMUSCULAR | Status: DC | PRN

## 2020-06-11 MED ORDER — KETOROLAC TROMETHAMINE 30 MG/ML IJ SOLN: 30.0000 mg | Freq: Once | INTRAMUSCULAR | Status: AC | PRN

## 2020-06-11 MED ORDER — DEXAMETHASONE SODIUM PHOSPHATE 10 MG/ML IJ SOLN
INTRAMUSCULAR | Status: DC | PRN
  Administered 2020-06-11: 10 mg via INTRAVENOUS

## 2020-06-11 MED ORDER — ACETAMINOPHEN 160 MG/5ML PO SOLN: 325.0000 mg | ORAL | Status: DC | PRN

## 2020-06-11 MED ORDER — MIDAZOLAM HCL 2 MG/2ML IJ SOLN
INTRAMUSCULAR | Status: AC
  Filled 2020-06-11: qty 2

## 2020-06-11 MED ORDER — FENTANYL CITRATE (PF) 100 MCG/2ML IJ SOLN
INTRAMUSCULAR | Status: AC
  Administered 2020-06-11: 25 ug via INTRAVENOUS
  Filled 2020-06-11: qty 2

## 2020-06-11 MED ORDER — ORAL CARE MOUTH RINSE: 15.0000 mL | Freq: Once | OROMUCOSAL | Status: AC

## 2020-06-11 MED ORDER — OXYCODONE HCL 5 MG PO TABS: 5.0000 mg | ORAL_TABLET | Freq: Once | ORAL | Status: AC | PRN

## 2020-06-11 MED ORDER — MEPERIDINE HCL 25 MG/ML IJ SOLN: 6.2500 mg | INTRAMUSCULAR | Status: DC | PRN

## 2020-06-11 MED ORDER — FENTANYL CITRATE (PF) 100 MCG/2ML IJ SOLN
INTRAMUSCULAR | Status: DC | PRN
  Administered 2020-06-11: 50 ug via INTRAVENOUS
  Administered 2020-06-11: 100 ug via INTRAVENOUS
  Administered 2020-06-11 (×2): 50 ug via INTRAVENOUS

## 2020-06-11 MED ORDER — ONDANSETRON HCL 4 MG/2ML IJ SOLN: 4.0000 mg | Freq: Once | INTRAMUSCULAR | Status: DC | PRN

## 2020-06-11 MED ORDER — LIDOCAINE 2% (20 MG/ML) 5 ML SYRINGE
INTRAMUSCULAR | Status: DC | PRN
  Administered 2020-06-11: 100 mg via INTRAVENOUS

## 2020-06-11 MED ORDER — CEFAZOLIN SODIUM-DEXTROSE 2-4 GM/100ML-% IV SOLN
2.0000 g | INTRAVENOUS | Status: AC
  Administered 2020-06-11: 2 g via INTRAVENOUS

## 2020-06-11 MED ORDER — FENTANYL CITRATE (PF) 250 MCG/5ML IJ SOLN
INTRAMUSCULAR | Status: AC
  Filled 2020-06-11: qty 5

## 2020-06-11 MED ORDER — ACETAMINOPHEN 325 MG PO TABS
ORAL_TABLET | ORAL | Status: AC
  Administered 2020-06-11: 650 mg via ORAL
  Filled 2020-06-11: qty 2

## 2020-06-11 SURGICAL SUPPLY — 26 items
BLADE SAW SGTL MED 73X18.5 STR (BLADE) ×3 IMPLANT
BLADE SURG 21 STRL SS (BLADE) ×3 IMPLANT
BNDG COHESIVE 4X5 TAN STRL (GAUZE/BANDAGES/DRESSINGS) ×3 IMPLANT
BNDG GAUZE ELAST 4 BULKY (GAUZE/BANDAGES/DRESSINGS) ×3 IMPLANT
COVER SURGICAL LIGHT HANDLE (MISCELLANEOUS) ×3 IMPLANT
DRAPE U-SHAPE 47X51 STRL (DRAPES) ×3 IMPLANT
DRSG ADAPTIC 3X8 NADH LF (GAUZE/BANDAGES/DRESSINGS) ×3 IMPLANT
DRSG PAD ABDOMINAL 8X10 ST (GAUZE/BANDAGES/DRESSINGS) ×3 IMPLANT
DURAPREP 26ML APPLICATOR (WOUND CARE) ×3 IMPLANT
ELECT REM PT RETURN 9FT ADLT (ELECTROSURGICAL) ×3
ELECTRODE REM PT RTRN 9FT ADLT (ELECTROSURGICAL) ×1 IMPLANT
GAUZE SPONGE 4X4 12PLY STRL (GAUZE/BANDAGES/DRESSINGS) ×3 IMPLANT
GLOVE BIOGEL PI IND STRL 9 (GLOVE) ×1 IMPLANT
GLOVE BIOGEL PI INDICATOR 9 (GLOVE) ×2
GLOVE SURG ORTHO 9.0 STRL STRW (GLOVE) ×3 IMPLANT
GOWN STRL REUS W/ TWL XL LVL3 (GOWN DISPOSABLE) ×2 IMPLANT
GOWN STRL REUS W/TWL XL LVL3 (GOWN DISPOSABLE) ×4
KIT BASIN OR (CUSTOM PROCEDURE TRAY) ×3 IMPLANT
KIT TURNOVER KIT B (KITS) ×3 IMPLANT
NS IRRIG 1000ML POUR BTL (IV SOLUTION) ×3 IMPLANT
PACK ORTHO EXTREMITY (CUSTOM PROCEDURE TRAY) ×3 IMPLANT
SUT ETHILON 2 0 PSLX (SUTURE) ×3 IMPLANT
TOWEL GREEN STERILE (TOWEL DISPOSABLE) ×3 IMPLANT
TUBE CONNECTING 12'X1/4 (SUCTIONS) ×1
TUBE CONNECTING 12X1/4 (SUCTIONS) ×2 IMPLANT
YANKAUER SUCT BULB TIP NO VENT (SUCTIONS) ×3 IMPLANT

## 2020-06-11 NOTE — Anesthesia Procedure Notes (Signed)
Procedure Name: LMA Insertion Date/Time: 06/11/2020 12:13 PM Performed by: Rosiland Oz, CRNA Pre-anesthesia Checklist: Patient identified, Emergency Drugs available, Suction available, Timeout performed and Patient being monitored Patient Re-evaluated:Patient Re-evaluated prior to induction Oxygen Delivery Method: Circle system utilized Preoxygenation: Pre-oxygenation with 100% oxygen Induction Type: IV induction LMA: LMA inserted LMA Size: 4.0 Number of attempts: 1 Placement Confirmation: positive ETCO2 and breath sounds checked- equal and bilateral Tube secured with: Tape Dental Injury: Teeth and Oropharynx as per pre-operative assessment

## 2020-06-11 NOTE — Op Note (Signed)
06/11/2020  12:57 PM  PATIENT:  Jeremy Cline    PRE-OPERATIVE DIAGNOSIS:  Osteomyelitis Left Great Toe  POST-OPERATIVE DIAGNOSIS:  Same  PROCEDURE:  LEFT FOOT 1ST RAY AMPUTATION Local tissue rearrangement for wound closure 10 x 4 cm  SURGEON:  Nadara Mustard, MD  PHYSICIAN ASSISTANT:None ANESTHESIA:   General  PREOPERATIVE INDICATIONS:  [name] is a  34 y.o. male with a diagnosis of Osteomyelitis Left Great Toe who failed conservative measures and elected for surgical management.    The risks benefits and alternatives were discussed with the patient preoperatively including but not limited to the risks of infection, bleeding, nerve injury, cardiopulmonary complications, the need for revision surgery, among others, and the patient was willing to proceed.  OPERATIVE IMPLANTS: None  @ENCIMAGES @  OPERATIVE FINDINGS: Complete destruction of the MTP joint from osteomyelitis tissue margins clear good petechial bleeding.  OPERATIVE PROCEDURE: Patient was brought the operating room and underwent a general anesthetic.  After adequate levels anesthesia were obtained patient's left lower extremity was prepped using DuraPrep draped into a sterile field a timeout was called.  A racquet incision was made around the ulcerative tissue to resect the first ray and ulcers within 1 block of tissue this required the incision to be more dorsally.  The ray was resected to the base of the first metatarsal there was complete destruction of the MTP joint from osteomyelitis there was no deep abscess.  The Ethibond suture as well as the sesamoids were resected as well.  The wound was irrigated with normal saline electrocautery was used hemostasis.  Local tissue rearrangement was used to close the wound 10 x 4 cm.  2-0 nylon was used for wound closure sterile dressing was applied patient was extubated taken the PACU in stable condition   DISCHARGE PLANNING:  Antibiotic duration: Preoperative  antibiotics  Weightbearing: Touchdown weightbearing on the left with postoperative shoe  Pain medication: Percocet  Dressing care/ Wound VAC: Follow-up in 1 week to change the dressing  Ambulatory devices: Crutches  Discharge to: Home.  Follow-up: In the office 1 week post operative.
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
## 2020-06-11 NOTE — Transfer of Care (Signed)
Immediate Anesthesia Transfer of Care Note  Patient: Jeremy Cline  Procedure(s) Performed: LEFT FOOT 1ST RAY AMPUTATION (Left Foot)  Patient Location: PACU  Anesthesia Type:General  Level of Consciousness: drowsy and patient cooperative  Airway & Oxygen Therapy: Patient Spontanous Breathing  Post-op Assessment: Report given to RN and Post -op Vital signs reviewed and stable  Post vital signs: Reviewed and stable  Last Vitals:  Vitals Value Taken Time  BP 125/90 06/11/20 1252  Temp    Pulse 99 06/11/20 1253  Resp 17 06/11/20 1253  SpO2 100 % 06/11/20 1253  Vitals shown include unvalidated device data.  Last Pain:  Vitals:   06/11/20 0956  TempSrc:   PainSc: 0-No pain         Complications: No complications documented.

## 2020-06-11 NOTE — Anesthesia Preprocedure Evaluation (Signed)
Anesthesia Evaluation  Patient identified by MRN, date of birth, ID band  Reviewed: Allergy & Precautions, NPO status , Patient's Chart, lab work & pertinent test results  Airway Mallampati: II  TM Distance: >3 FB Neck ROM: Full   Comment: Poor compliance due to pain Dental no notable dental hx.    Pulmonary neg pulmonary ROS, Patient abstained from smoking.,    breath sounds clear to auscultation (-) decreased breath sounds      Cardiovascular Exercise Tolerance: Good negative cardio ROS Normal cardiovascular exam Rhythm:Regular Rate:Normal     Neuro/Psych negative neurological ROS  negative psych ROS   GI/Hepatic negative GI ROS, (+)     substance abuse  alcohol use,   Endo/Other  negative endocrine ROS  Renal/GU negative Renal ROS  negative genitourinary   Musculoskeletal negative musculoskeletal ROS (+)   Abdominal Normal abdominal exam  (+)   Peds negative pediatric ROS (+)  Hematology negative hematology ROS (+)   Anesthesia Other Findings    Reproductive/Obstetrics                             Anesthesia Physical  Anesthesia Plan  ASA: II  Anesthesia Plan: General   Post-op Pain Management:    Induction: Intravenous  PONV Risk Score and Plan: Midazolam and Ondansetron  Airway Management Planned: LMA  Additional Equipment: None  Intra-op Plan:   Post-operative Plan: Extubation in OR  Informed Consent: I have reviewed the patients History and Physical, chart, labs and discussed the procedure including the risks, benefits and alternatives for the proposed anesthesia with the patient or authorized representative who has indicated his/her understanding and acceptance.     Dental advisory given  Plan Discussed with: CRNA and Anesthesiologist  Anesthesia Plan Comments:         Anesthesia Quick Evaluation

## 2020-06-13 ENCOUNTER — Encounter (HOSPITAL_COMMUNITY): Payer: Self-pay | Admitting: Orthopedic Surgery

## 2020-06-14 NOTE — Anesthesia Postprocedure Evaluation (Signed)
Anesthesia Post Note  Patient: Jeremy Cline  Procedure(s) Performed: LEFT FOOT 1ST RAY AMPUTATION (Left Foot)     Patient location during evaluation: PACU Anesthesia Type: General Level of consciousness: awake and sedated Pain management: pain level controlled Respiratory status: spontaneous breathing Cardiovascular status: stable Postop Assessment: no apparent nausea or vomiting Anesthetic complications: no   No complications documented.  Last Vitals:  Vitals:   06/11/20 1310 06/11/20 1355  BP: 107/66 129/78  Pulse: 81 84  Resp: 19 10  Temp:  36.6 C  SpO2: 99% 99%    Last Pain:  Vitals:   06/11/20 1355  TempSrc:   PainSc: 4                  John F Drezden Seitzinger Jr

## 2020-06-18 ENCOUNTER — Ambulatory Visit (INDEPENDENT_AMBULATORY_CARE_PROVIDER_SITE_OTHER): Payer: Self-pay | Admitting: Family

## 2020-06-18 ENCOUNTER — Encounter: Payer: Self-pay | Admitting: Family

## 2020-06-18 VITALS — Ht 76.0 in | Wt 160.0 lb

## 2020-06-18 DIAGNOSIS — M869 Osteomyelitis, unspecified: Secondary | ICD-10-CM

## 2020-06-18 NOTE — Progress Notes (Signed)
Office Visit Note   Patient: Jeremy Cline           Date of Birth: 03/03/1986           MRN: [id number] Visit Date: 06/18/2020              Requested by: No referring provider defined for this encounter. PCP: Patient, No Pcp Per  Chief Complaint  Patient presents with  . Left Foot - Routine Post Op    06/11/20 left foot 1st ray amputation       HPI: The patient is a 34 year old gentleman status post left first ray amputation on November 5. He has been nonweightbearing with crutches and a postop shoe  Assessment & Plan: Visit Diagnoses: No diagnosis found.  Plan: Begin daily Dial soap cleansing. Dry dressing changes. Nonweightbearing. He will follow-up in 2 weeks for suture removal.  Follow-Up Instructions: Return in about 2 weeks (around 07/02/2020).   Ortho Exam  Patient is alert, oriented, no adenopathy, well-dressed, normal affect, normal respiratory effort. On examination of the right foot the incision is well approximated sutures there is no gaping drainage moderate edema of the foot no sign of infection  Imaging: No results found. No images are attached to the encounter.  Labs: No results found for: HGBA1C, ESRSEDRATE, CRP, LABURIC, REPTSTATUS, GRAMSTAIN, CULT, LABORGA   Lab Results  Component Value Date   ALBUMIN 3.5 06/11/2020   ALBUMIN 3.7 04/07/2020    No results found for: MG Lab Results  Component Value Date   VD25OH 7.77 (L) 04/10/2020    No results found for: PREALBUMIN CBC EXTENDED Latest Ref Rng & Units 06/11/2020 04/11/2020 04/10/2020  WBC 4.0 - 10.5 K/uL 6.3 7.7 9.9  RBC 4.22 - 5.81 MIL/uL 4.48 3.55(L) 3.44(L)  HGB 13.0 - 17.0 g/dL 15.4 11.5(L) 10.9(L)  HCT 39 - 52 % 42.1 33.8(L) 33.1(L)  PLT 150 - 400 K/uL 317 234 176     Body mass index is 19.48 kg/m.  Orders:  No orders of the defined types were placed in this encounter.  No orders of the defined types were placed in this encounter.    Procedures: No procedures  performed  Clinical Data: No additional findings.  ROS:  All other systems negative, except as noted in the HPI. Review of Systems  Objective: Vital Signs: Ht 6\' 4"  (1.93 m)   Wt 160 lb (72.6 kg)   BMI 19.48 kg/m   Specialty Comments:  No specialty comments available.  PMFS History: Patient Active Problem List   Diagnosis Date Noted  . Osteomyelitis of great toe of left foot (HCC)   . Cutaneous abscess of left foot   . Skull fracture (HCC) 04/08/2020   Past Medical History:  Diagnosis Date  . Asthma    Pt stated "as a child"    Family History  Problem Relation Age of Onset  . Diabetes Mother     Past Surgical History:  Procedure Laterality Date  . AMPUTATION Left 06/11/2020   Procedure: LEFT FOOT 1ST RAY AMPUTATION;  Surgeon: 13/12/2019, MD;  Location: [facility] OR;  Service: Orthopedics;  Laterality: Left;  . APPLICATION OF WOUND VAC Bilateral 04/08/2020   Procedure: APPLICATION OF WOUND VAC;  Surgeon: 06/08/2020, MD;  Location: [facility] OR;  Service: Orthopedics;  Laterality: Bilateral;  . CLOSED REDUCTION METATARSAL Left 04/08/2020   Procedure: CLOSED REDUCTION METATARSAL WITH PERCUTANEOUS PINNING;  Surgeon: 06/08/2020, MD;  Location: [facility] OR;  Service: Orthopedics;  Laterality: Left;  .  I & D EXTREMITY Bilateral 04/08/2020   Procedure: IRRIGATION AND DEBRIDEMENT EXTREMITY;  Surgeon: Myrene Galas, MD;  Location: [facility] OR;  Service: Orthopedics;  Laterality: Bilateral;  . SCALP LACERATION REPAIR N/A 04/08/2020   Procedure: SCALP LACERATION REPAIR;  Surgeon: Violeta Gelinas, MD;  Location: [facility] OR;  Service: General;  Laterality: N/A;   Social History   Occupational History  . Not on file  Tobacco Use  . Smoking status: Never Smoker  . Smokeless tobacco: Never Used  Vaping Use  . Vaping Use: Never used  Substance and Sexual Activity  . Alcohol use: Yes  . Drug use: Yes    Types: Marijuana    Comment: occasional  . Sexual activity: Not on file

## 2020-06-30 ENCOUNTER — Telehealth (INDEPENDENT_AMBULATORY_CARE_PROVIDER_SITE_OTHER): Payer: Self-pay | Admitting: Family

## 2020-06-30 DIAGNOSIS — M869 Osteomyelitis, unspecified: Secondary | ICD-10-CM

## 2020-06-30 MED ORDER — OXYCODONE-ACETAMINOPHEN 5-325 MG PO TABS: 1.0000 | ORAL_TABLET | Freq: Four times a day (QID) | ORAL | 0 refills | Status: AC | PRN

## 2020-06-30 NOTE — Telephone Encounter (Signed)
Patient called requesting a refill of oxycodone. Patient is asking for refill to be sent to [facility] on [address] [place]. Please call patient when medication is called in at [phone].

## 2020-06-30 NOTE — Telephone Encounter (Signed)
Pt s/p 1st ray amputation 06/11/20 last refill was that day #30

## 2020-07-05 ENCOUNTER — Encounter: Payer: Self-pay | Admitting: Physician Assistant

## 2020-07-05 ENCOUNTER — Ambulatory Visit (INDEPENDENT_AMBULATORY_CARE_PROVIDER_SITE_OTHER): Payer: Self-pay | Admitting: Physician Assistant

## 2020-07-05 VITALS — Ht 76.0 in | Wt 160.0 lb

## 2020-07-05 DIAGNOSIS — M869 Osteomyelitis, unspecified: Secondary | ICD-10-CM

## 2020-07-05 NOTE — Progress Notes (Signed)
Office Visit Note   Patient: Jeremy Cline           Date of Birth: [birth year]           MRN: [id number] Visit Date: 07/05/2020              Requested by: No referring provider defined for this encounter. PCP: Patient, No Pcp Per  Chief Complaint  Patient presents with  . Left Foot - Routine Post Op    06/11/20 left foot 1st ray amputation       HPI: Patient presents today status post left first ray amputation 4 weeks ago.  He feels he is doing better.  He has occasional pain for which he uses pain medication.  Assessment & Plan: Visit Diagnoses: No diagnosis found.  Plan: Patient will follow up in 2 weeks at that time we will remove the most proximal stitch and the remaining stitch on the underside of his forefoot.  Follow-Up Instructions: No follow-ups on file.   Ortho Exam  Patient is alert, oriented, no adenopathy, well-dressed, normal affect, normal respiratory effort. Overall well-healed incision.  He does have a small area still bleeding proximally.  Sutures were heavily embedded in eschar.  Some of this eschar was debrided to remove the sutures.  There was one suture that was deep beneath the eschar patient was having difficulty tolerating removal.  He is going to start washing this daily and we will remove this when he follows up in a week.  No sign of infectious or cellulitis.  Swelling is well controlled.  Imaging: No results found. No images are attached to the encounter.  Labs: No results found for: HGBA1C, ESRSEDRATE, CRP, LABURIC, REPTSTATUS, GRAMSTAIN, CULT, LABORGA   Lab Results  Component Value Date   ALBUMIN 3.5 06/11/2020   ALBUMIN 3.7 04/07/2020    No results found for: MG Lab Results  Component Value Date   VD25OH 7.77 (L) 04/10/2020    No results found for: PREALBUMIN CBC EXTENDED Latest Ref Rng & Units 06/11/2020 04/11/2020 04/10/2020  WBC 4.0 - 10.5 K/uL 6.3 7.7 9.9  RBC 4.22 - 5.81 MIL/uL 4.48 3.55(L) 3.44(L)  HGB 13.0 - 17.0 g/dL  34.2 11.5(L) 10.9(L)  HCT 39 - 52 % 42.1 33.8(L) 33.1(L)  PLT 150 - 400 K/uL 317 234 176     Body mass index is 19.48 kg/m.  Orders:  No orders of the defined types were placed in this encounter.  No orders of the defined types were placed in this encounter.    Procedures: No procedures performed  Clinical Data: No additional findings.  ROS:  All other systems negative, except as noted in the HPI. Review of Systems  Objective: Vital Signs: Ht 6\' 4"  (1.93 m)   Wt 160 lb (72.6 kg)   BMI 19.48 kg/m   Specialty Comments:  No specialty comments available.  PMFS History: Patient Active Problem List   Diagnosis Date Noted  . Osteomyelitis of great toe of left foot (HCC)   . Cutaneous abscess of left foot   . Skull fracture (HCC) 04/08/2020   Past Medical History:  Diagnosis Date  . Asthma    Pt stated "as a child"    Family History  Problem Relation Age of Onset  . Diabetes Mother     Past Surgical History:  Procedure Laterality Date  . AMPUTATION Left 06/11/2020   Procedure: LEFT FOOT 1ST RAY AMPUTATION;  Surgeon: 13/12/2019, MD;  Location: [facility] OR;  Service:  Orthopedics;  Laterality: Left;  . APPLICATION OF WOUND VAC Bilateral 04/08/2020   Procedure: APPLICATION OF WOUND VAC;  Surgeon: Myrene Galas, MD;  Location: [facility] OR;  Service: Orthopedics;  Laterality: Bilateral;  . CLOSED REDUCTION METATARSAL Left 04/08/2020   Procedure: CLOSED REDUCTION METATARSAL WITH PERCUTANEOUS PINNING;  Surgeon: Myrene Galas, MD;  Location: [facility] OR;  Service: Orthopedics;  Laterality: Left;  . I & D EXTREMITY Bilateral 04/08/2020   Procedure: IRRIGATION AND DEBRIDEMENT EXTREMITY;  Surgeon: Myrene Galas, MD;  Location: [facility] OR;  Service: Orthopedics;  Laterality: Bilateral;  . SCALP LACERATION REPAIR N/A 04/08/2020   Procedure: SCALP LACERATION REPAIR;  Surgeon: Violeta Gelinas, MD;  Location: [facility] OR;  Service: General;  Laterality: N/A;   Social History   Occupational History  . Not  on file  Tobacco Use  . Smoking status: Never Smoker  . Smokeless tobacco: Never Used  Vaping Use  . Vaping Use: Never used  Substance and Sexual Activity  . Alcohol use: Yes  . Drug use: Yes    Types: Marijuana    Comment: occasional  . Sexual activity: Not on file

## 2020-07-19 ENCOUNTER — Encounter: Payer: Self-pay | Admitting: Physician Assistant

## 2020-07-19 ENCOUNTER — Ambulatory Visit (INDEPENDENT_AMBULATORY_CARE_PROVIDER_SITE_OTHER): Payer: Self-pay | Admitting: Physician Assistant

## 2020-07-19 VITALS — Ht 76.0 in | Wt 160.0 lb

## 2020-07-19 DIAGNOSIS — M79672 Pain in left foot: Secondary | ICD-10-CM

## 2020-07-19 NOTE — Progress Notes (Signed)
Office Visit Note   Patient: Jeremy Cline           Date of Birth: [birth year]           MRN: [id number] Visit Date: 07/19/2020              Requested by: No referring provider defined for this encounter. PCP: Patient, No Pcp Per  Chief Complaint  Patient presents with  . Left Foot - Routine Post Op    06/11/20 left foot 1 st ray amputation       HPI: Patient is 5 weeks status post left first ray amputation.  He is doing well.  He is here for removal of the remainder of surgical sutures.  Assessment & Plan: Visit Diagnoses: No diagnosis found.  Plan: Patient may begin weightbearing in his postop shoe.  He is given a prescription for [employer] for carbon fiber plate and filler.  He is going to wear a compression sock for his foot.  Follow-Up Instructions: No follow-ups on file.   Ortho Exam  Patient is alert, oriented, no adenopathy, well-dressed, normal affect, normal respiratory effort. Focused examination demonstrates healed surgical incision with thickened eschar.  Remaining 2 sutures were removed.  He does have a moderate amount of soft tissue swelling but no cellulitis.  Imaging: No results found. No images are attached to the encounter.  Labs: No results found for: HGBA1C, ESRSEDRATE, CRP, LABURIC, REPTSTATUS, GRAMSTAIN, CULT, LABORGA   Lab Results  Component Value Date   ALBUMIN 3.5 06/11/2020   ALBUMIN 3.7 04/07/2020    No results found for: MG Lab Results  Component Value Date   VD25OH 7.77 (L) 04/10/2020    No results found for: PREALBUMIN CBC EXTENDED Latest Ref Rng & Units 06/11/2020 04/11/2020 04/10/2020  WBC 4.0 - 10.5 K/uL 6.3 7.7 9.9  RBC 4.22 - 5.81 MIL/uL 4.48 3.55(L) 3.44(L)  HGB 13.0 - 17.0 g/dL 84.6 11.5(L) 10.9(L)  HCT 39.0 - 52.0 % 42.1 33.8(L) 33.1(L)  PLT 150 - 400 K/uL 317 234 176     Body mass index is 19.48 kg/m.  Orders:  No orders of the defined types were placed in this encounter.  No orders of the defined types were  placed in this encounter.    Procedures: No procedures performed  Clinical Data: No additional findings.  ROS:  All other systems negative, except as noted in the HPI. Review of Systems  Objective: Vital Signs: Ht 6\' 4"  (1.93 m)   Wt 160 lb (72.6 kg)   BMI 19.48 kg/m   Specialty Comments:  No specialty comments available.  PMFS History: Patient Active Problem List   Diagnosis Date Noted  . Osteomyelitis of great toe of left foot (HCC)   . Cutaneous abscess of left foot   . Skull fracture (HCC) 04/08/2020   Past Medical History:  Diagnosis Date  . Asthma    Pt stated "as a child"    Family History  Problem Relation Age of Onset  . Diabetes Mother     Past Surgical History:  Procedure Laterality Date  . AMPUTATION Left 06/11/2020   Procedure: LEFT FOOT 1ST RAY AMPUTATION;  Surgeon: 13/12/2019, MD;  Location: [facility] OR;  Service: Orthopedics;  Laterality: Left;  . APPLICATION OF WOUND VAC Bilateral 04/08/2020   Procedure: APPLICATION OF WOUND VAC;  Surgeon: 06/08/2020, MD;  Location: [facility] OR;  Service: Orthopedics;  Laterality: Bilateral;  . CLOSED REDUCTION METATARSAL Left 04/08/2020   Procedure: CLOSED REDUCTION METATARSAL  WITH PERCUTANEOUS PINNING;  Surgeon: Myrene Galas, MD;  Location: [facility] OR;  Service: Orthopedics;  Laterality: Left;  . I & D EXTREMITY Bilateral 04/08/2020   Procedure: IRRIGATION AND DEBRIDEMENT EXTREMITY;  Surgeon: Myrene Galas, MD;  Location: [facility] OR;  Service: Orthopedics;  Laterality: Bilateral;  . SCALP LACERATION REPAIR N/A 04/08/2020   Procedure: SCALP LACERATION REPAIR;  Surgeon: Violeta Gelinas, MD;  Location: [facility] OR;  Service: General;  Laterality: N/A;   Social History   Occupational History  . Not on file  Tobacco Use  . Smoking status: Never Smoker  . Smokeless tobacco: Never Used  Vaping Use  . Vaping Use: Never used  Substance and Sexual Activity  . Alcohol use: Yes  . Drug use: Yes    Types: Marijuana    Comment: occasional   . Sexual activity: Not on file

## 2020-08-16 ENCOUNTER — Ambulatory Visit (INDEPENDENT_AMBULATORY_CARE_PROVIDER_SITE_OTHER): Payer: Self-pay | Admitting: Orthopedic Surgery

## 2020-08-16 ENCOUNTER — Encounter: Payer: Self-pay | Admitting: Orthopedic Surgery

## 2020-08-16 VITALS — Ht 76.0 in | Wt 160.0 lb

## 2020-08-16 DIAGNOSIS — Z89412 Acquired absence of left great toe: Secondary | ICD-10-CM

## 2020-08-16 NOTE — Progress Notes (Signed)
Office Visit Note   Patient: Jeremy Cline           Date of Birth: 01/02/1986           MRN: [id number] Visit Date: 08/16/2020              Requested by: No referring provider defined for this encounter. PCP: Patient, No Pcp Per  Chief Complaint  Patient presents with  . Left Foot - Routine Post Op    06/21/2020 left foot 1st ray amputation       HPI: Patient is a 35 year old gentleman who presents approximately 2 months status post left foot first ray amputation is currently weightbearing in a fracture boot he states he feels well has no concerns is wearing a compression sock.  Assessment & Plan: Visit Diagnoses:  1. 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 Discussed that once he has the orthotic he can proceed with regular work without restrictions. Discussed the importance and demonstrated Achilles stretching.  Follow-Up Instructions: Return if symptoms worsen or fail to improve.   Ortho Exam  Patient is alert, oriented, no adenopathy, well-dressed, normal affect, normal respiratory effort. Examination incision is well-healed he has equinus contracture with dorsiflexion 10 degrees short of neutral patient was given instructions and demonstrated Achilles stretching to do 5 times a day a minute at a time.  Imaging: No results found. No images are attached to the encounter.  Labs: No results found for: HGBA1C, ESRSEDRATE, CRP, LABURIC, REPTSTATUS, GRAMSTAIN, CULT, LABORGA   Lab Results  Component Value Date   ALBUMIN 3.5 06/11/2020   ALBUMIN 3.7 04/07/2020    No results found for: MG Lab Results  Component Value Date   VD25OH 7.77 (L) 04/10/2020    No results found for: PREALBUMIN CBC EXTENDED Latest Ref Rng & Units 06/11/2020 04/11/2020 04/10/2020  WBC 4.0 - 10.5 K/uL 6.3 7.7 9.9  RBC 4.22 - 5.81 MIL/uL 4.48 3.55(L)  3.44(L)  HGB 13.0 - 17.0 g/dL 50.5 11.5(L) 10.9(L)  HCT 39.0 - 52.0 % 42.1 33.8(L) 33.1(L)  PLT 150 - 400 K/uL 317 234 176     Body mass index is 19.48 kg/m.  Orders:  No orders of the defined types were placed in this encounter.  No orders of the defined types were placed in this encounter.    Procedures: No procedures performed  Clinical Data: No additional findings.  ROS:  All other systems negative, except as noted in the HPI. Review of Systems  Objective: Vital Signs: Ht 6\' 4"  (1.93 m)   Wt 160 lb (72.6 kg)   BMI 19.48 kg/m   Specialty Comments:  No specialty comments available.  PMFS History: Patient Active Problem List   Diagnosis Date Noted  . Osteomyelitis of great toe of left foot (HCC)   . Cutaneous abscess of left foot   . Skull fracture (HCC) 04/08/2020   Past Medical History:  Diagnosis Date  . Asthma    Pt stated "as a child"    Family History  Problem Relation Age of Onset  . Diabetes Mother     Past Surgical History:  Procedure Laterality Date  . AMPUTATION Left 06/11/2020   Procedure: LEFT FOOT 1ST RAY AMPUTATION;  Surgeon: 13/12/2019, MD;  Location: [facility] OR;  Service: Orthopedics;  Laterality: Left;  . APPLICATION OF WOUND VAC Bilateral 04/08/2020  Procedure: APPLICATION OF WOUND VAC;  Surgeon: Myrene Galas, MD;  Location: [facility] OR;  Service: Orthopedics;  Laterality: Bilateral;  . CLOSED REDUCTION METATARSAL Left 04/08/2020   Procedure: CLOSED REDUCTION METATARSAL WITH PERCUTANEOUS PINNING;  Surgeon: Myrene Galas, MD;  Location: [facility] OR;  Service: Orthopedics;  Laterality: Left;  . I & D EXTREMITY Bilateral 04/08/2020   Procedure: IRRIGATION AND DEBRIDEMENT EXTREMITY;  Surgeon: Myrene Galas, MD;  Location[facility] OR;  Service: Orthopedics;  Laterality: Bilateral;  . SCALP LACERATION REPAIR N/A 04/08/2020   Procedure: SCALP LACERATION REPAIR;  Surgeon: Violeta Gelinas, MD;  Location: [facility] OR;  Service: General;  Laterality: N/A;   Social  History   Occupational History  . Not on file  Tobacco Use  . Smoking status: Never Smoker  . Smokeless tobacco: Never Used  Vaping Use  . Vaping Use: Never used  Substance and Sexual Activity  . Alcohol use: Yes  . Drug use: Yes    Types: Marijuana    Comment: occasional  . Sexual activity: Not on file

## 2020-11-30 IMAGING — DX DG FOOT 2V*L*
2 series · 2 of 2 positions shown · non-contrast
Comparison: None.

CLINICAL DATA: Trauma

EXAM:
LEFT FOOT - 2 VIEW

[foot]
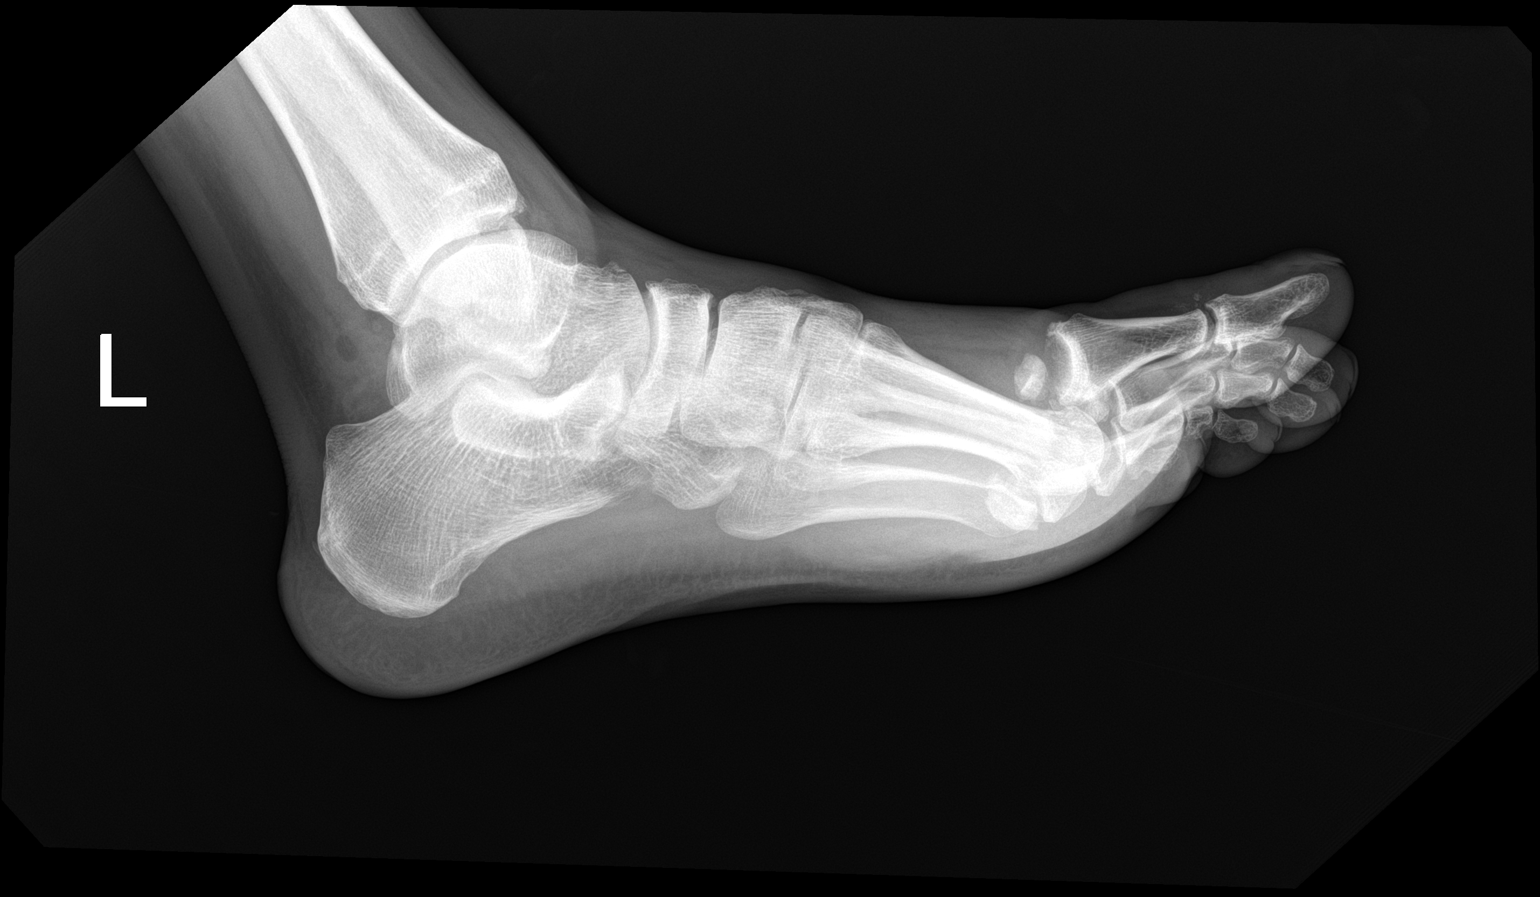

[leg]
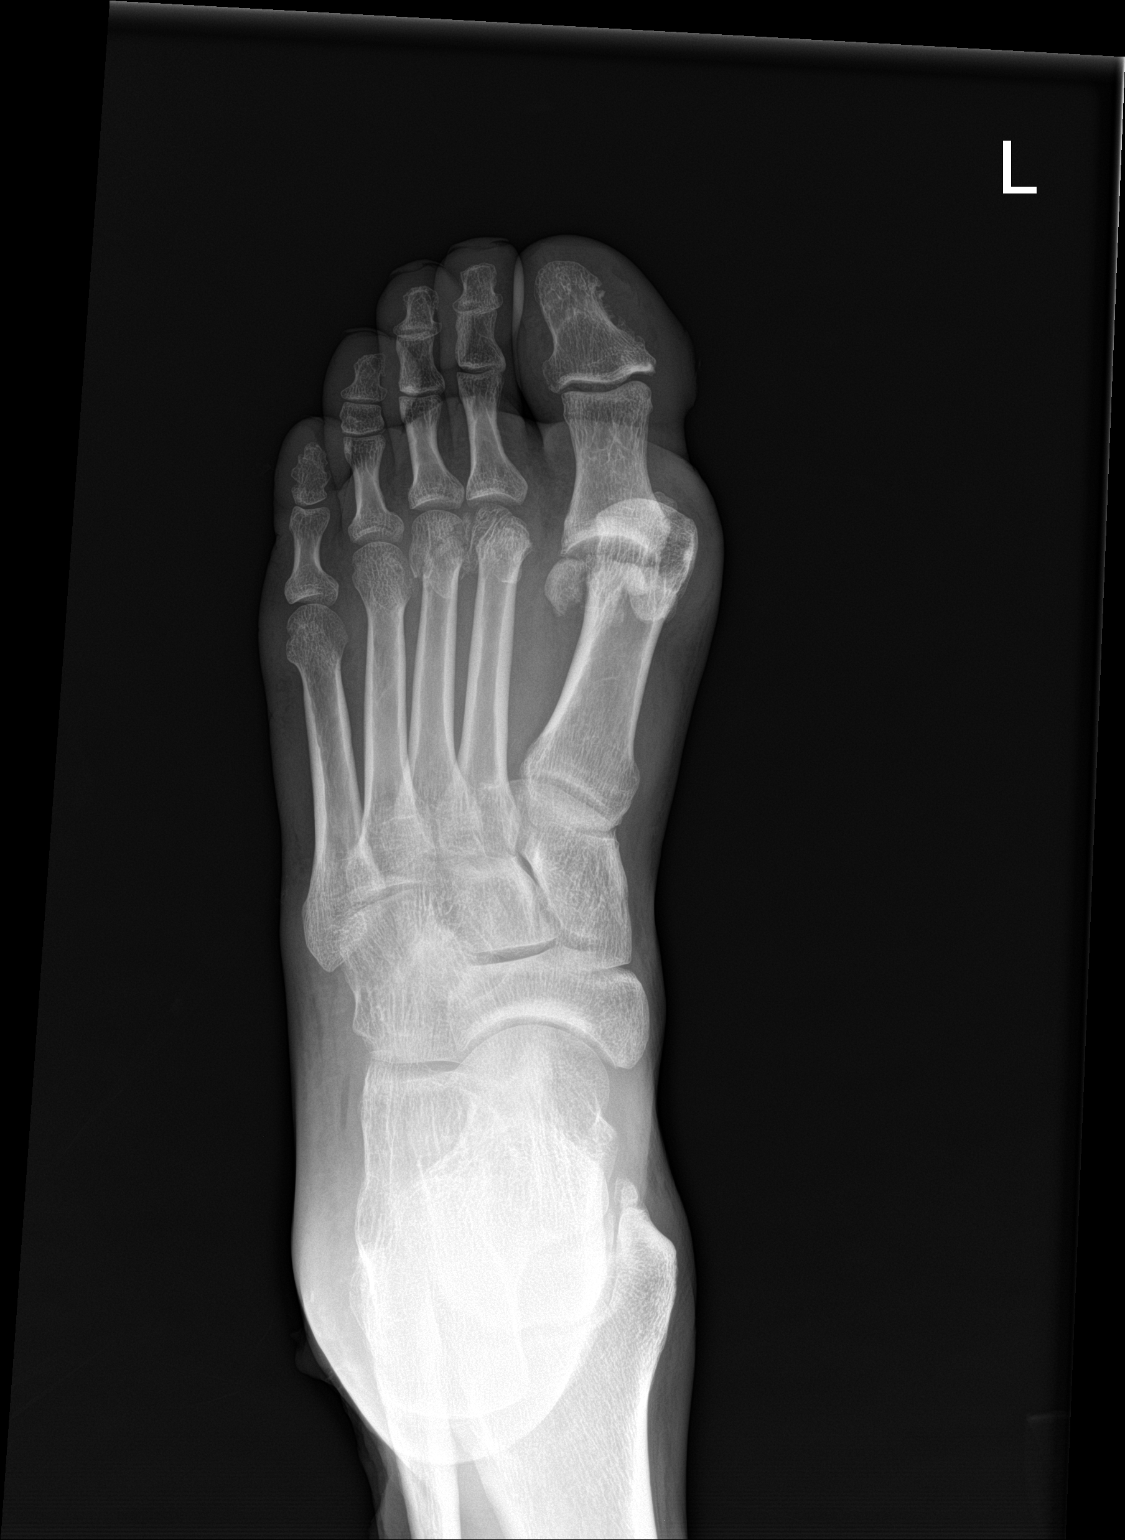

[2 of 2 positions shown; findings below may reference images not displayed]

FINDINGS: There is a superolateral dislocation at the first MTP joint. A
probable fractured lateral sesamoid is seen superiorly displaced.
Overlying soft tissue swelling is noted. There is also a probable
nondisplaced fracture seen at the lateral base of the distal first
phalanx. Overlying soft tissue swelling is seen. There is
nondisplaced fractures of the second and third metatarsal heads.
Significant overlying soft tissue swelling is noted. Partially
visualized distal fibular fracture is noted.
IMPRESSION: Superolateral dislocation at the first MTP joint.

Fractured superiorly displaced lateral sesamoid.

Nondisplaced fractures of the second and third metatarsal heads as
well as the lateral base of the first proximal phalanx.

Partially visualized distal fibular fracture.

## 2023-01-07 ENCOUNTER — Emergency Department (HOSPITAL_BASED_OUTPATIENT_CLINIC_OR_DEPARTMENT_OTHER)
Admission: EM | Admit: 2023-01-07 | Discharge: 2023-01-07 | Disposition: A | Discharge status: Home / Self Care | Payer: Managed Care, Other (non HMO) | Attending: Emergency Medicine | Admitting: Emergency Medicine

## 2023-01-07 ENCOUNTER — Encounter (HOSPITAL_BASED_OUTPATIENT_CLINIC_OR_DEPARTMENT_OTHER): Payer: Self-pay

## 2023-01-07 DIAGNOSIS — L723 Sebaceous cyst: Secondary | ICD-10-CM | POA: Diagnosis not present

## 2023-01-07 DIAGNOSIS — L989 Disorder of the skin and subcutaneous tissue, unspecified: Secondary | ICD-10-CM | POA: Diagnosis present

## 2023-01-07 DIAGNOSIS — J45909 Unspecified asthma, uncomplicated: Secondary | ICD-10-CM | POA: Diagnosis not present

## 2023-01-07 NOTE — Discharge Instructions (Addendum)
Please return to the ED with any new or worsening signs or symptoms Please follow-up with plastic surgery utilizing the number attached to this form.  Please call and make an appointment to be seen.

## 2023-01-07 NOTE — ED Provider Notes (Signed)
[place] EMERGENCY DEPARTMENT AT [facility] Provider Note   CSN: [id number] Arrival date & time: 01/07/23  1252     History  Chief Complaint  Patient presents with   facial lesion    Jeremy Cline is a 37 y.o. male with medical history of asthma.  The patient presents to the ED for evaluation of right-sided periorbital facial lesion.  The patient reports that for the last "few months" he is out of progressively worsening mass to the right side of his face lateral to his right orbit.  The patient states that this area has been progressively growing over this time.  He denies any drainage from this area.  He denies any pain to this area.  He denies fevers, nausea, vomiting, decreased eyesight.  He has not tried any medications at home for this.  HPI     Home Medications Prior to Admission medications   Not on File      Allergies    Patient has no known allergies.    Review of Systems   Review of Systems  Constitutional:  Negative for fever.  Gastrointestinal:  Negative for nausea and vomiting.  Skin:        Facial mass  All other systems reviewed and are negative.   Physical Exam Updated Vital Signs BP 130/63 (BP Location: Right Arm)   Pulse 86   Temp 98.2 F (36.8 C) (Oral)   Resp 16   SpO2 100%  Physical Exam Vitals and nursing note reviewed.  Constitutional:      General: He is not in acute distress.    Appearance: He is well-developed.  HENT:     Head: Normocephalic and atraumatic.   Eyes:     Conjunctiva/sclera: Conjunctivae normal.  Cardiovascular:     Rate and Rhythm: Normal rate and regular rhythm.     Heart sounds: No murmur heard. Pulmonary:     Effort: Pulmonary effort is normal. No respiratory distress.     Breath sounds: Normal breath sounds.  Abdominal:     Palpations: Abdomen is soft.     Tenderness: There is no abdominal tenderness.  Musculoskeletal:        General: No swelling.     Cervical back: Neck supple.   Skin:    General: Skin is warm and dry.     Capillary Refill: Capillary refill takes less than 2 seconds.  Neurological:     Mental Status: He is alert.  Psychiatric:        Mood and Affect: Mood normal.       ED Results / Procedures / Treatments   Labs (all labs ordered are listed, but only abnormal results are displayed) Labs Reviewed - No data to display  EKG None  Radiology No results found.  Procedures Procedures   Medications Ordered in ED Medications - No data to display  ED Course/ Medical Decision Making/ A&P  Medical Decision Making  37 year old male presents to ED for evaluation of mass to right portion of face.  Please see HPI for further details.  On examination the patient has a 2 x 3 cm mobile, nontender mass periorbitally to his right orbit.  There is no associated erythema.  The mass is nonpainful, it is mobile.  There is no drainage from this mass.  He states it has been progressively growing over the last few months.  This appears to be of a sebaceous cyst.  I had my attending Dr. Anitra Lauth evaluate this as well and  we feel that this patient should be sent to plastic surgery for further management and possible removal.  He agrees with this plan.  The patient will be referred to plastic surgery at this time.  He was advised to return to the ED with any new or worsening signs or symptoms.   Final Clinical Impression(s) / ED Diagnoses Final diagnoses:  Sebaceous cyst    Rx / DC Orders ED Discharge Orders     None         Clent Ridges 01/07/23 1535    Gwyneth Sprout, MD 01/10/23 [phone]

## 2023-01-07 NOTE — ED Triage Notes (Signed)
He has a painless lesion near lateral corner of right eye. He states he is "just tired of looking at it every day". ?sebaceous cyst?

## 2023-01-07 NOTE — ED Notes (Signed)
Pt given discharge instructions. Opportunities given for questions. Pt verbalizes understanding. Kyliee Ortego R, RN 

## 2023-03-01 ENCOUNTER — Ambulatory Visit
Admission: EM | Admit: 2023-03-01 | Discharge: 2023-03-01 | Disposition: A | Discharge status: Home / Self Care | Payer: Managed Care, Other (non HMO) | Attending: Family Medicine | Admitting: Family Medicine

## 2023-03-01 DIAGNOSIS — L089 Local infection of the skin and subcutaneous tissue, unspecified: Secondary | ICD-10-CM

## 2023-03-01 DIAGNOSIS — L723 Sebaceous cyst: Secondary | ICD-10-CM | POA: Diagnosis not present

## 2023-03-01 MED ORDER — SULFAMETHOXAZOLE-TRIMETHOPRIM 800-160 MG PO TABS: 1.0000 | ORAL_TABLET | Freq: Two times a day (BID) | ORAL | 0 refills | Status: AC

## 2023-03-01 NOTE — Discharge Instructions (Signed)
Keep scheduled follow-up to have cyst removal completed on Monday.  Complete entire course of antibiotics.

## 2023-03-01 NOTE — ED Triage Notes (Signed)
Pt c/o abscess to right side of face x ~3 months-worse x 1 week-NAD-steady gait

## 2023-03-01 NOTE — ED Provider Notes (Signed)
[facility]    CSN: [id number] Arrival date & time: 03/01/23  1544      History   Chief Complaint Chief Complaint  Patient presents with   Abscess    HPI Jeremy Cline is a 37 y.o. male.   HPI Patient with a sebaceous cyst on the right side of his face near his right eye is in today as he is concerned that the abscess is infected.  Patient was seen in the emergency department recently for evaluation of the cyst and to see if it could be removed emergently.  Emergency department provider referred him to plastics and he has an upcoming appointment scheduled on 03/05/2023. He is concern as the abscess is now enlarged, painful, and reddened.  He is here for evaluation today. He is afebrile.  Past Medical History:  Diagnosis Date   Asthma    Pt stated "as a child"    Patient Active Problem List   Diagnosis Date Noted   Osteomyelitis of great toe of left foot (HCC)    Cutaneous abscess of left foot    Skull fracture (HCC) 04/08/2020    Past Surgical History:  Procedure Laterality Date   AMPUTATION Left 06/11/2020   Procedure: LEFT FOOT 1ST RAY AMPUTATION;  Surgeon: Nadara Mustard, MD;  Location: [facility] OR;  Service: Orthopedics;  Laterality: Left;   APPLICATION OF WOUND VAC Bilateral 04/08/2020   Procedure: APPLICATION OF WOUND VAC;  Surgeon: Myrene Galas, MD;  Location: [facility] OR;  Service: Orthopedics;  Laterality: Bilateral;   CLOSED REDUCTION METATARSAL Left 04/08/2020   Procedure: CLOSED REDUCTION METATARSAL WITH PERCUTANEOUS PINNING;  Surgeon: Myrene Galas, MD;  Location: [facility] OR;  Service: Orthopedics;  Laterality: Left;   I & D EXTREMITY Bilateral 04/08/2020   Procedure: IRRIGATION AND DEBRIDEMENT EXTREMITY;  Surgeon: Myrene Galas, MD;  Location: [facility] OR;  Service: Orthopedics;  Laterality: Bilateral;   SCALP LACERATION REPAIR N/A 04/08/2020   Procedure: SCALP LACERATION REPAIR;  Surgeon: Violeta Gelinas, MD;  Location: [facility] OR;  Service: General;  Laterality: N/A;        Home Medications    Prior to Admission medications   Medication Sig Start Date End Date Taking? Authorizing Provider  sulfamethoxazole-trimethoprim (BACTRIM DS) 800-160 MG tablet Take 1 tablet by mouth 2 (two) times daily for 7 days. 03/01/23 03/08/23 Yes Bing Neighbors, NP    Family History Family History  Problem Relation Age of Onset   Diabetes Mother     Social History Social History   Tobacco Use   Smoking status: Some Days    Types: Cigars   Smokeless tobacco: Never  Vaping Use   Vaping status: Never Used  Substance Use Topics   Alcohol use: Yes    Comment: occ   Drug use: Not Currently    Types: Marijuana     Allergies   Patient has no known allergies.   Review of Systems Review of Systems Pertinent negatives listed in HPI   Physical Exam Triage Vital Signs ED Triage Vitals  Encounter Vitals Group     BP 03/01/23 1611 139/83     Systolic BP Percentile --      Diastolic BP Percentile --      Pulse Rate 03/01/23 1611 87     Resp 03/01/23 1611 18     Temp 03/01/23 1611 98.3 F (36.8 C)     Temp Source 03/01/23 1611 Oral     SpO2 03/01/23 1611 97 %     Weight --  Height --      Head Circumference --      Peak Flow --      Pain Score 03/01/23 1615 8     Pain Loc --      Pain Education --      Exclude from Growth Chart --    No data found.  Updated Vital Signs BP 139/83 (BP Location: Right Arm)   Pulse 87   Temp 98.3 F (36.8 C) (Oral)   Resp 18   SpO2 97%   Visual Acuity Right Eye Distance:   Left Eye Distance:   Bilateral Distance:    Right Eye Near:   Left Eye Near:    Bilateral Near:     Physical Exam Vitals reviewed.  Constitutional:      Appearance: Normal appearance.  HENT:     Head: Normocephalic and atraumatic.      Nose: Nose normal.  Eyes:     Extraocular Movements: Extraocular movements intact.     Pupils: Pupils are equal, round, and reactive to light.  Cardiovascular:     Rate and Rhythm: Normal  rate and regular rhythm.  Pulmonary:     Effort: Pulmonary effort is normal.     Breath sounds: Normal breath sounds.  Musculoskeletal:     Cervical back: Normal range of motion and neck supple.  Skin:    General: Skin is warm and dry.  Neurological:     General: No focal deficit present.     Mental Status: He is alert.     UC Treatments / Results  Labs (all labs ordered are listed, but only abnormal results are displayed) Labs Reviewed - No data to display  EKG   Radiology No results found.  Procedures Procedures (including critical care time)  Medications Ordered in UC Medications - No data to display  Initial Impression / Assessment and Plan / UC Course  I have reviewed the triage vital signs and the nursing notes.  Pertinent labs & imaging results that were available during my care of the patient were reviewed by me and considered in my medical decision making (see chart for details).    Infected sebaceous cyst skin, treatment with Bactrim BID x 7 days. Keep follow-up with dermatology.  Return precautions given if symptoms worsen or do not improve. Final Clinical Impressions(s) / UC Diagnoses   Final diagnoses:  Infected sebaceous cyst of skin     Discharge Instructions      Keep scheduled follow-up to have cyst removal completed on Monday.  Complete entire course of antibiotics.     ED Prescriptions     Medication Sig Dispense Auth. Provider   sulfamethoxazole-trimethoprim (BACTRIM DS) 800-160 MG tablet Take 1 tablet by mouth 2 (two) times daily for 7 days. 14 tablet Bing Neighbors, NP      PDMP not reviewed this encounter.   Bing Neighbors, NP 03/04/23 1743

## 2023-03-05 ENCOUNTER — Ambulatory Visit (INDEPENDENT_AMBULATORY_CARE_PROVIDER_SITE_OTHER): Payer: Managed Care, Other (non HMO) | Admitting: Plastic Surgery

## 2023-03-05 ENCOUNTER — Encounter: Payer: Self-pay | Admitting: Plastic Surgery

## 2023-03-05 VITALS — BP 120/77 | HR 85 | Resp 18 | Ht 76.0 in | Wt 181.8 lb

## 2023-03-05 DIAGNOSIS — L720 Epidermal cyst: Secondary | ICD-10-CM

## 2023-03-05 NOTE — Progress Notes (Signed)
   Referring Provider Al Decant, PA-C [address] [place],  [place] [address]   CC:  Chief Complaint  Patient presents with   Consult      Jeremy Cline is an 37 y.o. male.  HPI: Mr. Riedemann is a 37 year old male who presents today for evaluation of a cyst on the right side of his face at the right lateral orbital rim.  Patient states that he has swelling in this area and that it became acutely infected.  It drained last week and now there is a small open wound on the lateral orbital rim.  He currently has minimal discomfort.  No Known Allergies  Outpatient Encounter Medications as of 03/05/2023  Medication Sig   sulfamethoxazole-trimethoprim (BACTRIM DS) 800-160 MG tablet Take 1 tablet by mouth 2 (two) times daily for 7 days.   No facility-administered encounter medications on file as of 03/05/2023.     Past Medical History:  Diagnosis Date   Asthma    Pt stated "as a child"    Past Surgical History:  Procedure Laterality Date   AMPUTATION Left 06/11/2020   Procedure: LEFT FOOT 1ST RAY AMPUTATION;  Surgeon: Nadara Mustard, MD;  Location: [facility] OR;  Service: Orthopedics;  Laterality: Left;   APPLICATION OF WOUND VAC Bilateral 04/08/2020   Procedure: APPLICATION OF WOUND VAC;  Surgeon: Myrene Galas, MD;  Location: [facility] OR;  Service: Orthopedics;  Laterality: Bilateral;   CLOSED REDUCTION METATARSAL Left 04/08/2020   Procedure: CLOSED REDUCTION METATARSAL WITH PERCUTANEOUS PINNING;  Surgeon: Myrene Galas, MD;  Location: [facility] OR;  Service: Orthopedics;  Laterality: Left;   I & D EXTREMITY Bilateral 04/08/2020   Procedure: IRRIGATION AND DEBRIDEMENT EXTREMITY;  Surgeon: Myrene Galas, MD;  Location: [facility] OR;  Service: Orthopedics;  Laterality: Bilateral;   SCALP LACERATION REPAIR N/A 04/08/2020   Procedure: SCALP LACERATION REPAIR;  Surgeon: Violeta Gelinas, MD;  Location: [facility] OR;  Service: General;  Laterality: N/A;    Family History  Problem Relation Age of Onset    Diabetes Mother     Social History   Social History Narrative   ** Merged History Encounter **         Review of Systems General: Denies fevers, chills, weight loss CV: Denies chest pain, shortness of breath, palpitations Skin: Draining cyst at the right lateral orbital rim  Physical Exam    03/05/2023    1:26 PM 03/01/2023    4:11 PM 01/07/2023    3:49 PM  Vitals with BMI  Height 6\' 4"     Weight 181 lbs 13 oz    BMI 22.14    Systolic 120 139 401  Diastolic 77 83 76  Pulse 85 87 78    General:  No acute distress,  Alert and oriented, Non-Toxic, Normal speech and affect : As noted there is a open wound consistent with a previously infected epidermal inclusion cyst.   Assessment/Plan Open wound: Patient will continue to use bacitracin and keep the wound clean and covered.  He will return to see me in 2 weeks at which time we will make further plans for removal of the cyst under local in the office.  Santiago Glad 03/05/2023, 1:56 PM

## 2023-03-19 ENCOUNTER — Ambulatory Visit: Payer: Managed Care, Other (non HMO) | Admitting: Plastic Surgery

## 2023-03-26 ENCOUNTER — Ambulatory Visit: Payer: Managed Care, Other (non HMO) | Admitting: Plastic Surgery

## 2023-04-18 ENCOUNTER — Ambulatory Visit: Payer: Managed Care, Other (non HMO) | Admitting: Plastic Surgery
